# Patient Record
Sex: Male | Born: 1970
Health system: Southern US, Community
[De-identification: ages and names within clinical notes are randomized; demographics above are authoritative.]

## PROBLEM LIST (undated history)

## (undated) ENCOUNTER — Ambulatory Visit

## (undated) DIAGNOSIS — T7840XA Allergy, unspecified, initial encounter: Secondary | ICD-10-CM

## (undated) DIAGNOSIS — C801 Malignant (primary) neoplasm, unspecified: Secondary | ICD-10-CM

## (undated) DIAGNOSIS — J45909 Unspecified asthma, uncomplicated: Secondary | ICD-10-CM

## (undated) DIAGNOSIS — K219 Gastro-esophageal reflux disease without esophagitis: Secondary | ICD-10-CM

## (undated) HISTORY — PX: WRIST SURGERY: SHX841

## (undated) HISTORY — DX: Unspecified asthma, uncomplicated: J45.909

## (undated) HISTORY — DX: Allergy, unspecified, initial encounter: T78.40XA

## (undated) HISTORY — PX: FINGER SURGERY: SHX640

## (undated) HISTORY — DX: Malignant (primary) neoplasm, unspecified: C80.1

## (undated) HISTORY — PX: COLONOSCOPY: SHX174

## (undated) HISTORY — DX: Gastro-esophageal reflux disease without esophagitis: K21.9

---

## 1998-07-17 ENCOUNTER — Encounter: Payer: Self-pay | Admitting: Emergency Medicine

## 1998-07-17 ENCOUNTER — Emergency Department (HOSPITAL_COMMUNITY): Admission: EM | Admit: 1998-07-17 | Discharge: 1998-07-17 | Payer: Self-pay | Admitting: Emergency Medicine

## 1998-08-18 ENCOUNTER — Observation Stay (HOSPITAL_COMMUNITY): Admission: EM | Admit: 1998-08-18 | Discharge: 1998-08-19 | Payer: Self-pay | Admitting: Emergency Medicine

## 1998-08-19 ENCOUNTER — Encounter: Payer: Self-pay | Admitting: General Surgery

## 1998-09-25 ENCOUNTER — Encounter: Payer: Self-pay | Admitting: Emergency Medicine

## 1998-09-25 ENCOUNTER — Inpatient Hospital Stay: Admission: EM | Admit: 1998-09-25 | Discharge: 1998-09-28 | Payer: Self-pay | Admitting: Emergency Medicine

## 1998-09-26 ENCOUNTER — Encounter: Payer: Self-pay | Admitting: Cardiothoracic Surgery

## 1998-09-27 ENCOUNTER — Encounter: Payer: Self-pay | Admitting: Orthopedic Surgery

## 1998-09-27 ENCOUNTER — Encounter: Payer: Self-pay | Admitting: Cardiothoracic Surgery

## 1998-09-28 ENCOUNTER — Encounter: Payer: Self-pay | Admitting: Cardiothoracic Surgery

## 1999-06-19 ENCOUNTER — Encounter: Payer: Self-pay | Admitting: Emergency Medicine

## 1999-06-20 ENCOUNTER — Inpatient Hospital Stay (HOSPITAL_COMMUNITY): Admission: EM | Admit: 1999-06-20 | Discharge: 1999-06-24 | Payer: Self-pay

## 2004-06-14 ENCOUNTER — Emergency Department (HOSPITAL_COMMUNITY): Admission: EM | Admit: 2004-06-14 | Discharge: 2004-06-14 | Payer: Self-pay | Admitting: Emergency Medicine

## 2005-04-03 ENCOUNTER — Emergency Department (HOSPITAL_COMMUNITY): Admission: EM | Admit: 2005-04-03 | Discharge: 2005-04-03 | Payer: Self-pay | Admitting: Emergency Medicine

## 2006-03-14 ENCOUNTER — Encounter: Payer: Self-pay | Admitting: Emergency Medicine

## 2007-05-23 ENCOUNTER — Ambulatory Visit (HOSPITAL_COMMUNITY): Admission: RE | Admit: 2007-05-23 | Discharge: 2007-05-23 | Payer: Self-pay | Admitting: Internal Medicine

## 2008-09-03 ENCOUNTER — Emergency Department (HOSPITAL_COMMUNITY): Admission: EM | Admit: 2008-09-03 | Discharge: 2008-09-03 | Payer: Self-pay | Admitting: Emergency Medicine

## 2008-09-05 ENCOUNTER — Ambulatory Visit (HOSPITAL_COMMUNITY): Admission: RE | Admit: 2008-09-05 | Discharge: 2008-09-05 | Payer: Self-pay | Admitting: Orthopedic Surgery

## 2009-11-13 HISTORY — PX: KNEE ARTHROSCOPY: SHX127

## 2010-04-27 ENCOUNTER — Ambulatory Visit: Payer: Self-pay | Admitting: Diagnostic Radiology

## 2010-04-27 ENCOUNTER — Emergency Department (HOSPITAL_BASED_OUTPATIENT_CLINIC_OR_DEPARTMENT_OTHER): Admission: EM | Admit: 2010-04-27 | Discharge: 2010-04-27 | Payer: Self-pay | Admitting: Emergency Medicine

## 2010-06-29 ENCOUNTER — Encounter: Admission: RE | Admit: 2010-06-29 | Discharge: 2010-06-29 | Payer: Self-pay | Admitting: Orthopaedic Surgery

## 2011-03-28 NOTE — Op Note (Signed)
NAME:  Bradley Roy, Bradley Roy           ACCOUNT NO.:  [id number]   MEDICAL RECORD NO.:  [id number]          PATIENT TYPE:  AMB   LOCATION:  SDS                          FACILITY:  [facility]   PHYSICIAN:  Nadara Mustard, MD     DATE OF BIRTH:  Nov 10, 1971   DATE OF PROCEDURE:  09/05/2008  DATE OF DISCHARGE:                               OPERATIVE REPORT   PREOPERATIVE DIAGNOSIS:  Crush injury, right long finger.   POSTOPERATIVE DIAGNOSIS:  Crush injury, right long finger.   PROCEDURE:  Irrigation and debridement, exploration, and neurolysis,  right long finger.   SURGEON:  Nadara Mustard, MD   ANESTHESIA:  General.   ESTIMATED BLOOD LOSS:  Minimal.   ANTIBIOTICS:  1 gram of Kefzol.   DRAINS:  None.   COMPLICATIONS:  None.   TOURNIQUET TIME:  Approximately 20 minutes at 250 mmHg at the arm.   DISPOSITION:  To PACU in stable condition.   PROCEDURE:  The patient is a 40 year old gentleman who was working on a  Runner, broadcasting/film/video, one of the booms caught his finger in an opening and crushed the  finger through the 2 metal pieces of the crane boom.  The patient was  brought to the emergency room.  He underwent irrigation and debridement,  loose wound closure and was seen in the office yesterday.  Examination  in the office, he did have FDS function, but had complete lack of FDP  function.  He did have capillary refill of approximately 2 seconds and  had numbness on the radial and ulnar aspects of the finger, but did have  gross sensation.  Due to lack of FDP function and decreased sensation,  the patient was brought at this time to the operating room for FDP  repair, exploration, and possible nerve repair.  Risks and benefits were  discussed including infection, neurovascular injury, persistent pain,  loss of function, dystrophic changes, and potential loss of finger.  The  patient states he understands and wished to proceed at this time.   DESCRIPTION OF PROCEDURE:  The patient was brought to OR  room #1 and  underwent a general anesthetic.  After adequate level of anesthesia  obtained, the patient's right upper extremity was prepped using DuraPrep  and draped into a sterile field.  The sutures were removed.  The wounds  were irrigated with normal saline.  The dorsal wound was explored and  the dorsal wound extended from the PIP joint to the DIP joint dorsally.  Examination showed an intact extensor mechanism with no other injuries  other than the complex laceration.  This was repaired.  The wound was  approximately 4 cm in length and this was repaired with 4-0 nylon.  Attention was then focused on the palmar aspect of the finger.  The  complex wound was obliquely across the volar aspect of the DIP.  This  was then extended in an extensile fashion proximally with a Z technique  proximally and distally.  Examination down to the pulleys of the flexor  tendon showed intact pulleys and showed an intact FDP and FDS.  These  were not  disturbed.  Tension on the FDP showed good attachment with no  loss of continuity of the FDP.  Neurolysis of the radial, ulnar, and  digital bundles showed disruption of part of the digital nerves as they  bifurcated at the DIP joint.  Dr. Mina Marble was in the room to look at  this and he did not feel that the repair was necessary.  At the  bifurcation, there was continuity of nerve fibers intact.  It was hard  of the bifurcation that was completely avulsed.  After irrigation and  debridement, the tourniquet was deflated after approximately 20 minutes;  however, this was released prior to the end of the surgery.  Further  irrigation and debridement and the wound was closed using 4-0 nylon.  The length of the wound was approximately 6 cm.  After closure, Adaptic  was applied, 4x4s, Webril, and Coban.  The patient was extubated, taken  to PACU in stable condition.  Prescription for Vicodin and doxycycline.  He will complete his course of Augmentin.  Plan to  follow up in the  office in 1 week.      Nadara Mustard, MD  Electronically Signed     MVD/MEDQ  D:  09/05/2008  T:  09/05/2008  Job:  [id number]

## 2011-08-15 LAB — DIFFERENTIAL
Basophils Relative: 1
Eosinophils Absolute: 0.1
Eosinophils Relative: 1
Lymphocytes Relative: 27
Lymphs Abs: 1.7
Monocytes Absolute: 0.6
Monocytes Relative: 10

## 2011-08-15 LAB — COMPREHENSIVE METABOLIC PANEL
ALT: 15
AST: 19
Albumin: 4.4
Alkaline Phosphatase: 44
Calcium: 9.3
Creatinine, Ser: 0.92
GFR calc non Af Amer: >60
Potassium: 4.8
Total Bilirubin: 1.7 — ABNORMAL HIGH

## 2011-08-15 LAB — PROTIME-INR: INR: 1

## 2011-08-15 LAB — CBC
HCT: 45.8
Hemoglobin: 15.8
MCHC: 34.4
Platelets: 240
RDW: 12.8

## 2014-02-09 ENCOUNTER — Other Ambulatory Visit: Payer: Self-pay | Admitting: Internal Medicine

## 2014-02-22 DIAGNOSIS — J45909 Unspecified asthma, uncomplicated: Secondary | ICD-10-CM | POA: Insufficient documentation

## 2014-02-24 ENCOUNTER — Ambulatory Visit (INDEPENDENT_AMBULATORY_CARE_PROVIDER_SITE_OTHER): Payer: BC Managed Care – PPO | Admitting: Physician Assistant

## 2014-02-24 ENCOUNTER — Encounter: Payer: Self-pay | Admitting: Internal Medicine

## 2014-02-24 VITALS — BP 116/80 | HR 84 | Temp 98.1°F | Resp 16 | Ht 72.5 in | Wt 211.0 lb

## 2014-02-24 DIAGNOSIS — J329 Chronic sinusitis, unspecified: Secondary | ICD-10-CM

## 2014-02-24 DIAGNOSIS — M545 Low back pain, unspecified: Secondary | ICD-10-CM

## 2014-02-24 DIAGNOSIS — Z79899 Other long term (current) drug therapy: Secondary | ICD-10-CM

## 2014-02-24 DIAGNOSIS — IMO0001 Reserved for inherently not codable concepts without codable children: Secondary | ICD-10-CM

## 2014-02-24 DIAGNOSIS — I10 Essential (primary) hypertension: Secondary | ICD-10-CM

## 2014-02-24 LAB — BASIC METABOLIC PANEL WITH GFR
BUN: 16 mg/dL (ref 6–23)
CHLORIDE: 100 meq/L (ref 96–112)
CO2: 29 meq/L (ref 19–32)
Calcium: 9 mg/dL (ref 8.4–10.5)
Creat: 1.04 mg/dL (ref 0.50–1.35)
GFR, EST NON AFRICAN AMERICAN: 89 mL/min
GLUCOSE: 82 mg/dL (ref 70–99)
Potassium: 4.4 mEq/L (ref 3.5–5.3)
SODIUM: 136 meq/L (ref 135–145)

## 2014-02-24 LAB — HEPATIC FUNCTION PANEL
ALT: 15 U/L (ref 0–53)
AST: 18 U/L (ref 0–37)
Albumin: 4.3 g/dL (ref 3.5–5.2)
Alkaline Phosphatase: 51 U/L (ref 39–117)
BILIRUBIN INDIRECT: 0.9 mg/dL (ref 0.2–1.2)
BILIRUBIN TOTAL: 1.1 mg/dL (ref 0.2–1.2)
Bilirubin, Direct: 0.2 mg/dL (ref 0.0–0.3)
TOTAL PROTEIN: 7.1 g/dL (ref 6.0–8.3)

## 2014-02-24 LAB — MAGNESIUM: Magnesium: 1.8 mg/dL (ref 1.5–2.5)

## 2014-02-24 LAB — CK: CK TOTAL: 50 U/L (ref 7–232)

## 2014-02-24 MED ORDER — PREDNISONE 20 MG PO TABS: ORAL_TABLET | ORAL | Status: DC

## 2014-02-24 MED ORDER — AZITHROMYCIN 250 MG PO TABS: ORAL_TABLET | ORAL | Status: AC

## 2014-02-24 MED ORDER — DEXAMETHASONE SODIUM PHOSPHATE 10 MG/ML IJ SOLN
10.0000 mg | Freq: Once | INTRAMUSCULAR | Status: AC
  Administered 2014-02-24: 10 mg via INTRAMUSCULAR

## 2014-02-24 MED ORDER — HYDROCODONE-ACETAMINOPHEN 5-325 MG PO TABS: 1.0000 | ORAL_TABLET | Freq: Four times a day (QID) | ORAL | Status: DC | PRN

## 2014-02-24 NOTE — Patient Instructions (Signed)
Myalgia, Adult Myalgia is the medical term for muscle pain. It is a symptom of many things. Nearly everyone at some time in their life has this. The most common cause for muscle pain is overuse or straining and more so when you are not in shape. Injuries and muscle bruises cause myalgias. Muscle pain without a history of injury can also be caused by a virus. It frequently comes along with the flu. Myalgia not caused by muscle strain can be present in a large number of infectious diseases. Some autoimmune diseases like lupus and fibromyalgia can cause muscle pain. Myalgia may be mild, or severe. SYMPTOMS  The symptoms of myalgia are simply muscle pain. Most of the time this is short lived and the pain goes away without treatment. DIAGNOSIS  Myalgia is diagnosed by your caregiver by taking your history. This means you tell him when the problems began, what they are, and what has been happening. If this has not been a long term problem, your caregiver may want to watch for a while to see what will happen. If it has been long term, they may want to do additional testing. TREATMENT  The treatment depends on what the underlying cause of the muscle pain is. Often anti-inflammatory medications will help. HOME CARE INSTRUCTIONS  If the pain in your muscles came from overuse, slow down your activities until the problems go away.  Myalgia from overuse of a muscle can be treated with alternating hot and cold packs on the muscle affected or with cold for the first couple days. If either heat or cold seems to make things worse, stop their use.  Apply ice to the sore area for 15-20 minutes, 03-04 times per day, while awake for the first 2 days of muscle soreness, or as directed. Put the ice in a plastic bag and place a towel between the bag of ice and your skin.  Only take over-the-counter or prescription medicines for pain, discomfort, or fever as directed by your caregiver.  Regular gentle exercise may help if  you are not active.  Stretching before strenuous exercise can help lower the risk of myalgia. It is normal when beginning an exercise regimen to feel some muscle pain after exercising. Muscles that have not been used frequently will be sore at first. If the pain is extreme, this may mean injury to a muscle. SEEK MEDICAL CARE IF:  You have an increase in muscle pain that is not relieved with medication.  You begin to run a temperature.  You develop nausea and vomiting.  You develop a stiff and painful neck.  You develop a rash.  You develop muscle pain after a tick bite.  You have continued muscle pain while working out even after you are in good condition. SEEK IMMEDIATE MEDICAL CARE IF: Any of your problems are getting worse and medications are not helping. MAKE SURE YOU:   Understand these instructions.  Will watch your condition.sinus Sinusitis Sinusitis is redness, soreness, and swelling (inflammation) of the paranasal sinuses. Paranasal sinuses are air pockets within the bones of your face (beneath the eyes, the middle of the forehead, or above the eyes). In healthy paranasal sinuses, mucus is able to drain out, and air is able to circulate through them by way of your nose. However, when your paranasal sinuses are inflamed, mucus and air can become trapped. This can allow bacteria and other germs to grow and cause infection. Sinusitis can develop quickly and last only a short time (acute) or  continue over a long period (chronic). Sinusitis that lasts for more than 12 weeks is considered chronic.  CAUSES  Causes of sinusitis include: Allergies. Structural abnormalities, such as displacement of the cartilage that separates your nostrils (deviated septum), which can decrease the air flow through your nose and sinuses and affect sinus drainage. Functional abnormalities, such as when the small hairs (cilia) that line your sinuses and help remove mucus do not work properly or are not  present. SYMPTOMS  Symptoms of acute and chronic sinusitis are the same. The primary symptoms are pain and pressure around the affected sinuses. Other symptoms include: Upper toothache. Earache. Headache. Bad breath. Decreased sense of smell and taste. A cough, which worsens when you are lying flat. Fatigue. Fever. Thick drainage from your nose, which often is green and may contain pus (purulent). Swelling and warmth over the affected sinuses. DIAGNOSIS  Your caregiver will perform a physical exam. During the exam, your caregiver may: Look in your nose for signs of abnormal growths in your nostrils (nasal polyps). Tap over the affected sinus to check for signs of infection. View the inside of your sinuses (endoscopy) with a special imaging device with a light attached (endoscope), which is inserted into your sinuses. If your caregiver suspects that you have chronic sinusitis, one or more of the following tests may be recommended: Allergy tests. Nasal culture A sample of mucus is taken from your nose and sent to a lab and screened for bacteria. Nasal cytology A sample of mucus is taken from your nose and examined by your caregiver to determine if your sinusitis is related to an allergy. TREATMENT  Most cases of acute sinusitis are related to a viral infection and will resolve on their own within 10 days. Sometimes medicines are prescribed to help relieve symptoms (pain medicine, decongestants, nasal steroid sprays, or saline sprays).  However, for sinusitis related to a bacterial infection, your caregiver will prescribe antibiotic medicines. These are medicines that will help kill the bacteria causing the infection.  Rarely, sinusitis is caused by a fungal infection. In theses cases, your caregiver will prescribe antifungal medicine. For some cases of chronic sinusitis, surgery is needed. Generally, these are cases in which sinusitis recurs more than 3 times per year, despite other  treatments. HOME CARE INSTRUCTIONS  Drink plenty of water. Water helps thin the mucus so your sinuses can drain more easily. Use a humidifier. Inhale steam 3 to 4 times a day (for example, sit in the bathroom with the shower running). Apply a warm, moist washcloth to your face 3 to 4 times a day, or as directed by your caregiver. Use saline nasal sprays to help moisten and clean your sinuses. Take over-the-counter or prescription medicines for pain, discomfort, or fever only as directed by your caregiver. SEEK IMMEDIATE MEDICAL CARE IF: You have increasing pain or severe headaches. You have nausea, vomiting, or drowsiness. You have swelling around your face. You have vision problems. You have a stiff neck. You have difficulty breathing. MAKE SURE YOU:  Understand these instructions. Will watch your condition. Will get help right away if you are not doing well or get worse. Document Released: 10/30/2005 Document Revised: 01/22/2012 Document Reviewed: 11/14/2011 [facility] Patient Information 2014 [place], [place].   Will get help right away if you are not doing well or get worse.   Document Released: 09/21/2006 Document Revised: 01/22/2012 Document Reviewed: 12/11/2006 [employer] Patient Information 2014 [place], [place].

## 2014-02-24 NOTE — Progress Notes (Signed)
   Subjective:    Patient ID: Bradley Roy, male    DOB: [birth year], 43 y.o.   MRN: [id number]  HPI 43 y.o. states for the past week he has been feeling bed. He thought that is was allergies, got more flonase but then it got worse. He has had a fever of 99 - 101, sinus pain, aches and pain, hips and back hurting. He went to [facility] Sunday and was put on Cefdinir 300mg  and loratatine and a shot of something. He has had sinus congestion, pain, slightly productive cough with some wheezing, no SOB, CP. Denies nausea, diarrhea. He has bilateral leg pain with radiation down both legs, R>L.    Review of Systems  Constitutional: Positive for fever and fatigue. Negative for chills and diaphoresis.  HENT: Positive for congestion, sinus pressure, sneezing and sore throat. Negative for ear pain, trouble swallowing and voice change.   Eyes: Negative.   Respiratory: Positive for cough. Negative for chest tightness, shortness of breath and wheezing.   Cardiovascular: Negative.   Gastrointestinal: Negative.   Genitourinary: Negative.   Musculoskeletal: Positive for myalgias (bilateral leg pain, worse when sitting still for too long). Negative for arthralgias, back pain, gait problem, joint swelling, neck pain and neck stiffness.  Neurological: Negative.  Negative for headaches.       Objective:   Physical Exam  Constitutional: He appears well-developed and well-nourished.  HENT:  Head: Normocephalic and atraumatic.  Right Ear: External ear normal.  Left Ear: External ear normal.  Nose: Right sinus exhibits maxillary sinus tenderness. Left sinus exhibits maxillary sinus tenderness.  Mouth/Throat: Oropharynx is clear and moist.  Eyes: Conjunctivae are normal. Pupils are equal, round, and reactive to light.  Neck: Normal range of motion. Neck supple.  Cardiovascular: Normal rate, regular rhythm and normal heart sounds.   Pulmonary/Chest: Effort normal and breath sounds normal. He has no wheezes.   Abdominal: Soft. Bowel sounds are normal.  Musculoskeletal:  + RSI pain, negative straight leg raise.   Lymphadenopathy:    He has no cervical adenopathy.  Skin: Skin is warm and dry.       Assessment & Plan:  Myalgias- will give dexamethasone 10mg  shot RSI, check CPK, etc Sinusitis- stop cefnidir and start zpak 1 prednisone 20 mg # 10

## 2014-02-25 ENCOUNTER — Ambulatory Visit: Payer: Self-pay | Admitting: Internal Medicine

## 2014-02-25 LAB — CBC WITH DIFFERENTIAL/PLATELET
BASOS ABS: 0 10*3/uL (ref 0.0–0.1)
Basophils Relative: 1 % (ref 0–1)
EOS ABS: 0.2 10*3/uL (ref 0.0–0.7)
EOS PCT: 5 % (ref 0–5)
HEMATOCRIT: 44.4 % (ref 39.0–52.0)
HEMOGLOBIN: 16.7 g/dL (ref 13.0–17.0)
Lymphocytes Relative: 32 % (ref 12–46)
Lymphs Abs: 1.2 10*3/uL (ref 0.7–4.0)
MCH: 30.8 pg (ref 26.0–34.0)
MCHC: 34.4 g/dL (ref 30.0–36.0)
MCV: 81.9 fL (ref 78.0–100.0)
MONOS PCT: 19 % — AB (ref 3–12)
Monocytes Absolute: 0.7 10*3/uL (ref 0.1–1.0)
NEUTROS PCT: 43 % (ref 43–77)
Neutro Abs: 1.7 10*3/uL (ref 1.7–7.7)
Platelets: 172 10*3/uL (ref 150–400)
RBC: 5.42 MIL/uL (ref 4.22–5.81)
RDW: 13.1 % (ref 11.5–15.5)
WBC: 3.9 10*3/uL — AB (ref 4.0–10.5)

## 2014-03-08 ENCOUNTER — Other Ambulatory Visit: Payer: Self-pay | Admitting: Physician Assistant

## 2014-05-03 DIAGNOSIS — E559 Vitamin D deficiency, unspecified: Secondary | ICD-10-CM | POA: Insufficient documentation

## 2014-05-03 DIAGNOSIS — Z79899 Other long term (current) drug therapy: Secondary | ICD-10-CM | POA: Insufficient documentation

## 2014-05-03 NOTE — Progress Notes (Signed)
Patient ID: Bradley Roy, male   DOB: 17-Nov-1970, 43 y.o.   MRN: [id number]   Annual Screening Comprehensive Examination   This very nice 43 y.o.male presents for complete physical.  Patient has no major health issues.     Finally, patient has history of Vitamin D Deficiency with last vitamin D      Current Outpatient Prescriptions on File Prior to Visit  Medication Sig Dispense Refill  . azelastine (ASTELIN) 137 MCG/SPRAY nasal spray USE 1-2 SPRAYS IN EACH NOSTRIL TWICE A DAY  30 mL  6  . fluticasone (FLONASE) 50 MCG/ACT nasal spray USE 1 SPRAY IN EACH NOSTRIL TWICE A DAY  16 g  6  . HYDROcodone-acetaminophen (NORCO) 5-325 MG per tablet Take 1 tablet by mouth every 6 (six) hours as needed for moderate pain.  60 tablet  0   No current facility-administered medications on file prior to visit.    Allergies  Allergen Reactions  . Codeine     Headache    Past Medical History  Diagnosis Date  . Allergy   . Asthma     Past Surgical History  Procedure Laterality Date  . Wrist surgery Left   . Knee arthroscopy Right 2011  . Finger surgery Right     middle finger    No family history on file.  History   Social History  . Marital Status: Married    Spouse Name: N/A    Number of Children: N/A  . Years of Education: N/A   Occupational History  . Not on file.   Social History Main Topics  . Smoking status: Former Research scientist (life sciences)  . Smokeless tobacco: Not on file  . Alcohol Use: No     Comment: Rare  . Drug Use: No  . Sexual Activity: Not on file   Other Topics Concern  . Not on file   Social History Narrative  . No narrative on file    ROS Constitutional: Denies fever, chills, weight loss/gain, headaches, insomnia, fatigue, night sweats, and change in appetite. Eyes: Denies redness, blurred vision, diplopia, discharge, itchy, watery eyes.  ENT: Denies discharge, congestion, post nasal drip, epistaxis, sore throat, earache, hearing loss, dental pain, Tinnitus,  Vertigo, Sinus pain, snoring.  Cardio: Denies chest pain, palpitations, irregular heartbeat, syncope, dyspnea, diaphoresis, orthopnea, PND, claudication, edema Respiratory: denies cough, dyspnea, DOE, pleurisy, hoarseness, laryngitis, wheezing.  Gastrointestinal: Denies dysphagia, heartburn, reflux, water brash, pain, cramps, nausea, vomiting, bloating, diarrhea, constipation, hematemesis, melena, hematochezia, jaundice, hemorrhoids Genitourinary: Denies dysuria, frequency, urgency, nocturia, hesitancy, discharge, hematuria, flank pain Breast: Breast lumps, nipple discharge, bleeding.  Musculoskeletal: Denies arthralgia, myalgia, stiffness, Jt. Swelling, pain, limp, and strain/sprain. Skin: Denies puritis, rash, hives, warts, acne, eczema, changing in skin lesion Neuro: Weakness, tremor, incoordination, spasms, paresthesia, pain Psychiatric: Denies confusion, memory loss, sensory loss. Endocrine: Denies change in weight, skin, hair change, nocturia, and paresthesia, diabetic polys, visual blurring, hyper /hypo glycemic episodes.  Heme/Lymph: No excessive bleeding, bruising, enlarged lymph nodes.   Physical Exam  There were no vitals taken for this visit.  General Appearance: Well nourished, in no apparent distress. Eyes: PERRLA, EOMs, conjunctiva no swelling or erythema, normal fundi and vessels. Sinuses: No frontal/maxillary tenderness ENT/Mouth: EACs patent / TMs  nl. Nares clear without erythema, swelling, mucoid exudates. Oral hygiene is good. No erythema, swelling, or exudate. Tongue normal, non-obstructing. Tonsils not swollen or erythematous. Hearing normal.  Neck: Supple, thyroid normal. No bruits, nodes or JVD. Respiratory: Respiratory effort normal.  BS equal and clear bilateral  without rales, rhonci, wheezing or stridor. Cardio: Heart sounds are normal with regular rate and rhythm and no murmurs, rubs or gallops. Peripheral pulses are normal and equal bilaterally without edema. No  aortic or femoral bruits. Chest: symmetric with normal excursions and percussion. Breasts: Symmetric, without lumps, nipple discharge, retractions, or fibrocystic changes.  Abdomen: Flat, soft, with bowl sounds. Nontender, no guarding, rebound, hernias, masses, or organomegaly.  Lymphatics: Non tender without lymphadenopathy.  Genitourinary:  Musculoskeletal: Full ROM all peripheral extremities, joint stability, 5/5 strength, and normal gait. Skin: Warm and dry without rashes, lesions, cyanosis, clubbing or  ecchymosis.  Neuro: Cranial nerves intact, reflexes equal bilaterally. Normal muscle tone, no cerebellar symptoms. Sensation intact.  Pysch: Awake and oriented X 3, normal affect, Insight and Judgment appropriate.   Assessment and Plan  1. Annual Screening Examination 2. Asthma  3. Vitamin D Deficiency   4.    Continue prudent diet as discussed, weight control, regular exercise, and medications. Routine screening labs and tests as requested with regular follow-up as recommended.

## 2014-05-04 ENCOUNTER — Ambulatory Visit: Payer: BC Managed Care – PPO | Admitting: Internal Medicine

## 2014-05-04 ENCOUNTER — Encounter: Payer: Self-pay | Admitting: Internal Medicine

## 2014-05-04 VITALS — BP 106/68 | HR 56 | Temp 98.1°F | Resp 16 | Ht 73.5 in | Wt 208.6 lb

## 2014-05-04 DIAGNOSIS — Z1212 Encounter for screening for malignant neoplasm of rectum: Secondary | ICD-10-CM

## 2014-05-04 DIAGNOSIS — Z79899 Other long term (current) drug therapy: Secondary | ICD-10-CM

## 2014-05-04 DIAGNOSIS — Z113 Encounter for screening for infections with a predominantly sexual mode of transmission: Secondary | ICD-10-CM

## 2014-05-04 DIAGNOSIS — R740 Nonspecific elevation of levels of transaminase and lactic acid dehydrogenase [LDH]: Secondary | ICD-10-CM

## 2014-05-04 DIAGNOSIS — Z125 Encounter for screening for malignant neoplasm of prostate: Secondary | ICD-10-CM

## 2014-05-04 DIAGNOSIS — C44229 Squamous cell carcinoma of skin of left ear and external auricular canal: Secondary | ICD-10-CM

## 2014-05-04 DIAGNOSIS — E559 Vitamin D deficiency, unspecified: Secondary | ICD-10-CM

## 2014-05-04 DIAGNOSIS — Z23 Encounter for immunization: Secondary | ICD-10-CM

## 2014-05-04 DIAGNOSIS — Z111 Encounter for screening for respiratory tuberculosis: Secondary | ICD-10-CM

## 2014-05-04 DIAGNOSIS — J45909 Unspecified asthma, uncomplicated: Secondary | ICD-10-CM

## 2014-05-04 DIAGNOSIS — R7401 Elevation of levels of liver transaminase levels: Secondary | ICD-10-CM

## 2014-05-04 DIAGNOSIS — Z0000 Encounter for general adult medical examination without abnormal findings: Secondary | ICD-10-CM

## 2014-05-04 LAB — CBC WITH DIFFERENTIAL/PLATELET
Basophils Absolute: 0.1 10*3/uL (ref 0.0–0.1)
Basophils Relative: 1 % (ref 0–1)
EOS PCT: 2 % (ref 0–5)
Eosinophils Absolute: 0.1 10*3/uL (ref 0.0–0.7)
HEMATOCRIT: 42.1 % (ref 39.0–52.0)
Hemoglobin: 15.2 g/dL (ref 13.0–17.0)
LYMPHS ABS: 1.9 10*3/uL (ref 0.7–4.0)
Lymphocytes Relative: 31 % (ref 12–46)
MCH: 31 pg (ref 26.0–34.0)
MCHC: 36.1 g/dL — ABNORMAL HIGH (ref 30.0–36.0)
MCV: 85.7 fL (ref 78.0–100.0)
MONO ABS: 0.5 10*3/uL (ref 0.1–1.0)
MONOS PCT: 9 % (ref 3–12)
NEUTROS ABS: 3.4 10*3/uL (ref 1.7–7.7)
Neutrophils Relative %: 57 % (ref 43–77)
Platelets: 239 10*3/uL (ref 150–400)
RBC: 4.91 MIL/uL (ref 4.22–5.81)
RDW: 13.8 % (ref 11.5–15.5)
WBC: 6 10*3/uL (ref 4.0–10.5)

## 2014-05-04 NOTE — Progress Notes (Signed)
Patient ID: Bradley Roy, male   DOB: Aug 06, 1971, 43 y.o.   MRN: [id number]   Annual Screening Comprehensive Examination   This very nice 43 y.o.MWM presents for complete physical.  Patient has no major health issues. He does have remote Hx/o elevated BP's of 138-140/90 in [id number] 2012 and ha been monitored expectantly.Today's BP is WNL @ 106/68. He doe have a Hx/o seasonal allergic rhinitis.   Finally, patient has history of Vitamin D Deficiency of 33 in 2008 and last vitamin D was 56 in 2012.  Medication Sig  . ASTELIN nasal spray USE 1-2 SPRAYS IN EACH NOSTRIL TWICE A DAY  . FLONASE  nasal spray USE 1 SPRAY IN EACH NOSTRIL TWICE A DAY   Allergies  Allergen Reactions  . Codeine     Headache   Past Medical History  Diagnosis Date  . Allergy   . Asthma    Past Surgical History  Procedure Laterality Date  . Wrist surgery Left   . Knee arthroscopy Right 2011  . Finger surgery Right     middle finger   History   Social History  . Marital Status: Married    Spouse Name: N/A    Number of Children: N/A  . 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 Smoking status: Former Research scientist (life sciences)  . Smokeless tobacco: Not on file  . Alcohol Use: No     Comment: Rare  . Drug Use: No  . Sexual Activity: Not on file    ROS Constitutional: Denies fever, chills, weight loss/gain, headaches, insomnia, fatigue, night sweats, and change in appetite. Eyes: Denies redness, blurred vision, diplopia, discharge, itchy, watery eyes.  ENT: Denies discharge, congestion, post nasal drip, epistaxis, sore throat, earache, hearing loss, dental pain, Tinnitus, Vertigo, Sinus pain, snoring.  Cardio: Denies chest pain, palpitations, irregular heartbeat, syncope, dyspnea, diaphoresis, orthopnea, PND, claudication, edema Respiratory: denies cough, dyspnea, DOE, pleurisy, hoarseness, laryngitis, wheezing.  Gastrointestinal: Denies dysphagia,  heartburn, reflux, water brash, pain, cramps, nausea, vomiting, bloating, diarrhea, constipation, hematemesis, melena, hematochezia, jaundice, hemorrhoids Genitourinary: Denies dysuria, frequency, urgency, nocturia, hesitancy, discharge, hematuria, flank pain Breast: Breast lumps, nipple discharge, bleeding.  Musculoskeletal: Denies arthralgia, myalgia, stiffness, Jt. Swelling, pain, limp, and strain/sprain. Skin: Denies puritis, rash, hives, warts, acne, eczema, changing in skin lesion Neuro: Weakness, tremor, incoordination, spasms, paresthesia, pain Psychiatric: Denies confusion, memory loss, sensory loss. Endocrine: Denies change in weight, skin, hair change, nocturia, and paresthesia, diabetic polys, visual blurring, hyper /hypo glycemic episodes.  Heme/Lymph: No excessive bleeding, bruising, enlarged lymph nodes.   Physical Exam  BP 106/68  Pulse 56  Temp 98.1 F   Resp 16  Ht 6' 1.5"   Wt 208 lb 9.6 oz   BMI 27.15 kg/m2  General Appearance: Well nourished, in no apparent distress. Eyes: PERRLA, EOMs, conjunctiva no swelling or erythema, normal fundi and vessels. Sinuses: No frontal/maxillary tenderness ENT/Mouth: EACs patent / TMs  nl. Nares clear without erythema, swelling, mucoid exudates. Oral hygiene is good. No erythema, swelling, or exudate. Tongue normal, non-obstructing. Tonsils not swollen or erythematous. Hearing normal.  Neck: Supple, thyroid normal. No bruits, nodes or JVD. Respiratory: Respiratory effort normal.  BS equal and clear bilateral without rales, rhonci, wheezing or stridor. Cardio: Heart sounds are normal with regular rate and rhythm and no murmurs, rubs or gallops. Peripheral pulses are normal and equal bilaterally without edema. No aortic or femoral bruits. Chest: symmetric with normal excursions and percussion.  Abdomen: Flat, soft, with bowl sounds. Nontender, no guarding, rebound, hernias, masses, or organomegaly.  Lymphatics: Non tender without  lymphadenopathy.  Genitourinary: Nl male. Testes & cords nl. No hernia. Musculoskeletal: Full ROM all peripheral extremities, joint stability, 5/5 strength, and normal gait. Skin: Warm and dry without rashes, lesions, cyanosis, clubbing or  ecchymosis.  Neuro: Cranial nerves intact, reflexes equal bilaterally. Normal muscle tone, no cerebellar symptoms. Sensation intact.  Pysch: Awake and oriented X 3, normal affect, Insight and Judgment appropriate.   Assessment and Plan  1. Annual Screening Examination 2. Hx/o elevated BP 3. Seasonal Allergic Rhinitis 4. Vitamin D Deficiency    Continue prudent diet as discussed, weight control, regular exercise, and medications. Routine screening labs and tests as requested with regular follow-up as recommended.

## 2014-05-04 NOTE — Patient Instructions (Signed)
Preventative Care for Adults, Male       REGULAR HEALTH EXAMS:  A routine yearly physical is a good way to check in with your primary care provider about your health and preventive screening. It is also an opportunity to share updates about your health and any concerns you have, and receive a thorough all-over exam.   Most health insurance companies pay for at least some preventative services.  Check with your health plan for specific coverages.  WHAT PREVENTATIVE SERVICES DO MEN NEED?  Adult men should have their weight and blood pressure checked regularly.   Men age 35 and older should have their cholesterol levels checked regularly.  Beginning at age 50 and continuing to age 75, men should be screened for colorectal cancer.  Certain people should may need continued testing until age 85.  Other cancer screening may include exams for testicular and prostate cancer.  Updating vaccinations is part of preventative care.  Vaccinations help protect against diseases such as the flu.  Lab tests are generally done as part of preventative care to screen for anemia and blood disorders, to screen for problems with the kidneys and liver, to screen for bladder problems, to check blood sugar, and to check your cholesterol level.  Preventative services generally include counseling about diet, exercise, avoiding tobacco, drugs, excessive alcohol consumption, and sexually transmitted infections.    GENERAL RECOMMENDATIONS FOR GOOD HEALTH:  Healthy diet:  Eat a variety of foods, including fruit, vegetables, animal or vegetable protein, such as meat, fish, chicken, and eggs, or beans, lentils, tofu, and grains, such as rice.  Drink plenty of water daily.  Decrease saturated fat in the diet, avoid lots of red meat, processed foods, sweets, fast foods, and fried foods.  Exercise:  Aerobic exercise helps maintain good heart health. At least 30-40 minutes of moderate-intensity exercise is recommended.  For example, a brisk walk that increases your heart rate and breathing. This should be done on most days of the week.   Find a type of exercise or a variety of exercises that you enjoy so that it becomes a part of your daily life.  Examples are running, walking, swimming, water aerobics, and biking.  For motivation and support, explore group exercise such as aerobic class, spin class, Zumba, Yoga,or  martial arts, etc.    Set exercise goals for yourself, such as a certain weight goal, walk or run in a race such as a 5k walk/run.  Speak to your primary care provider about exercise goals.  Disease prevention:  If you smoke or chew tobacco, find out from your caregiver how to quit. It can literally save your life, no matter how long you have been a tobacco user. If you do not use tobacco, never begin.   Maintain a healthy diet and normal weight. Increased weight leads to problems with blood pressure and diabetes.   The Body Mass Index or BMI is a way of measuring how much of your body is fat. Having a BMI above 27 increases the risk of heart disease, diabetes, hypertension, stroke and other problems related to obesity. Your caregiver can help determine your BMI and based on it develop an exercise and dietary program to help you achieve or maintain this important measurement at a healthful level.  High blood pressure causes heart and blood vessel problems.  Persistent high blood pressure should be treated with medicine if weight loss and exercise do not work.   Fat and cholesterol leaves deposits in your arteries   that can block them. This causes heart disease and vessel disease elsewhere in your body.  If your cholesterol is found to be high, or if you have heart disease or certain other medical conditions, then you may need to have your cholesterol monitored frequently and be treated with medication.   Ask if you should have a stress test if your history suggests this. A stress test is a test done on  a treadmill that looks for heart disease. This test can find disease prior to there being a problem.  Avoid drinking alcohol in excess (more than two drinks per day).  Avoid use of street drugs. Do not share needles with anyone. Ask for professional help if you need assistance or instructions on stopping the use of alcohol, cigarettes, and/or drugs.  Brush your teeth twice a day with fluoride toothpaste, and floss once a day. Good oral hygiene prevents tooth decay and gum disease. The problems can be painful, unattractive, and can cause other health problems. Visit your dentist for a routine oral and dental check up and preventive care every 6-12 months.   Look at your skin regularly.  Use a mirror to look at your back. Notify your caregivers of changes in moles, especially if there are changes in shapes, colors, a size larger than a pencil eraser, an irregular border, or development of new moles.  Safety:  Use seatbelts 100% of the time, whether driving or as a passenger.  Use safety devices such as hearing protection if you work in environments with loud noise or significant background noise.  Use safety glasses when doing any work that could send debris in to the eyes.  Use a helmet if you ride a bike or motorcycle.  Use appropriate safety gear for contact sports.  Talk to your caregiver about gun safety.  Use sunscreen with a SPF (or skin protection factor) of 15 or greater.  Lighter skinned people are at a greater risk of skin cancer. Don't forget to also wear sunglasses in order to protect your eyes from too much damaging sunlight. Damaging sunlight can accelerate cataract formation.   Practice safe sex. Use condoms. Condoms are used for birth control and to help reduce the spread of sexually transmitted infections (or STIs).  Some of the STIs are gonorrhea (the clap), chlamydia, syphilis, trichomonas, herpes, HPV (human papilloma virus) and HIV (human immunodeficiency virus) which causes AIDS.  The herpes, HIV and HPV are viral illnesses that have no cure. These can result in disability, cancer and death.   Keep carbon monoxide and smoke detectors in your home functioning at all times. Change the batteries every 6 months or use a model that plugs into the wall.   Vaccinations:  Stay up to date with your tetanus shots and other required immunizations. You should have a booster for tetanus every 10 years. Be sure to get your flu shot every year, since 5%-20% of the [place] population comes down with the flu. The flu vaccine changes each year, so being vaccinated once is not enough. Get your shot in the fall, before the flu season peaks.   Other vaccines to consider:  Pneumococcal vaccine to protect against certain types of pneumonia.  This is normally recommended for adults age 65 or older.  However, adults younger than 41 years old with certain underlying conditions such as diabetes, heart or lung disease should also receive the vaccine.  Shingles vaccine to protect against Varicella Zoster if you are older than age 60, or younger   than 41 years old with certain underlying illness.  Hepatitis A vaccine to protect against a form of infection of the liver by a virus acquired from food.  Hepatitis B vaccine to protect against a form of infection of the liver by a virus acquired from blood or body fluids, particularly if you work in health care.  If you plan to travel internationally, check with your local health department for specific vaccination recommendations.  Cancer Screening:  Most routine colon cancer screening begins at the age of 50. On a yearly basis, doctors may provide special easy to use take-home tests to check for hidden blood in the stool. Sigmoidoscopy or colonoscopy can detect the earliest forms of colon cancer and is life saving. These tests use a small camera at the end of a tube to directly examine the colon. Speak to your caregiver about this at age 50, when routine  screening begins (and is repeated every 5 years unless early forms of pre-cancerous polyps or small growths are found).   At the age of 50 men usually start screening for prostate cancer every year. Screening may begin at a younger age for those with higher risk. Those at higher risk include African-Americans or having a family history of prostate cancer. There are two types of tests for prostate cancer:   Prostate-specific antigen (PSA) testing. Recent studies raise questions about prostate cancer using PSA and you should discuss this with your caregiver.   Digital rectal exam (in which your doctor's lubricated and gloved finger feels for enlargement of the prostate through the anus).   Screening for testicular cancer.  Do a monthly exam of your testicles. Gently roll each testicle between your thumb and fingers, feeling for any abnormal lumps. The best time to do this is after a hot shower or bath when the tissues are looser. Notify your caregivers of any lumps, tenderness or changes in size or shape immediately.     

## 2014-05-05 LAB — PSA: PSA: 0.58 ng/mL (ref <=4.00)

## 2014-05-05 LAB — URINALYSIS, ROUTINE W REFLEX MICROSCOPIC
BILIRUBIN URINE: NEGATIVE
Glucose, UA: NEGATIVE mg/dL
Hgb urine dipstick: NEGATIVE
KETONES UR: NEGATIVE mg/dL
Leukocytes, UA: NEGATIVE
NITRITE: NEGATIVE
Protein, ur: NEGATIVE mg/dL
SPECIFIC GRAVITY, URINE: 1.027 (ref 1.005–1.030)
Urobilinogen, UA: 1 mg/dL (ref 0.0–1.0)
pH: 6 (ref 5.0–8.0)

## 2014-05-05 LAB — HIV ANTIBODY (ROUTINE TESTING W REFLEX): HIV 1&2 Ab, 4th Generation: NONREACTIVE

## 2014-05-05 LAB — LIPID PANEL
CHOL/HDL RATIO: 3.4 ratio
Cholesterol: 176 mg/dL (ref 0–200)
HDL: 52 mg/dL (ref >39)
LDL Cholesterol: 107 mg/dL — ABNORMAL HIGH (ref 0–99)
Triglycerides: 83 mg/dL (ref <150)
VLDL: 17 mg/dL (ref 0–40)

## 2014-05-05 LAB — VITAMIN D 25 HYDROXY (VIT D DEFICIENCY, FRACTURES): Vit D, 25-Hydroxy: 47 ng/mL (ref 30–89)

## 2014-05-05 LAB — BASIC METABOLIC PANEL WITH GFR
BUN: 21 mg/dL (ref 6–23)
CALCIUM: 9.7 mg/dL (ref 8.4–10.5)
CO2: 29 meq/L (ref 19–32)
CREATININE: 1.15 mg/dL (ref 0.50–1.35)
Chloride: 103 mEq/L (ref 96–112)
GFR, Est Non African American: 79 mL/min
GLUCOSE: 84 mg/dL (ref 70–99)
Potassium: 4.5 mEq/L (ref 3.5–5.3)
Sodium: 140 mEq/L (ref 135–145)

## 2014-05-05 LAB — HEPATITIS B CORE ANTIBODY, TOTAL: HEP B C TOTAL AB: NONREACTIVE

## 2014-05-05 LAB — MAGNESIUM: MAGNESIUM: 1.9 mg/dL (ref 1.5–2.5)

## 2014-05-05 LAB — TESTOSTERONE: TESTOSTERONE: 431 ng/dL (ref 300–890)

## 2014-05-05 LAB — RPR

## 2014-05-05 LAB — HEPATIC FUNCTION PANEL
ALBUMIN: 4.7 g/dL (ref 3.5–5.2)
ALK PHOS: 47 U/L (ref 39–117)
ALT: 10 U/L (ref 0–53)
AST: 14 U/L (ref 0–37)
Bilirubin, Direct: 0.3 mg/dL (ref 0.0–0.3)
Indirect Bilirubin: 1 mg/dL (ref 0.2–1.2)
Total Bilirubin: 1.3 mg/dL — ABNORMAL HIGH (ref 0.2–1.2)
Total Protein: 7.1 g/dL (ref 6.0–8.3)

## 2014-05-05 LAB — MICROALBUMIN / CREATININE URINE RATIO
CREATININE, URINE: 227.4 mg/dL
Microalb Creat Ratio: 2.2 mg/g (ref 0.0–30.0)
Microalb, Ur: 0.5 mg/dL (ref 0.00–1.89)

## 2014-05-05 LAB — HEMOGLOBIN A1C
Hgb A1c MFr Bld: 4.8 % (ref <5.7)
Mean Plasma Glucose: 91 mg/dL (ref <117)

## 2014-05-05 LAB — HEPATITIS C ANTIBODY: HCV AB: NEGATIVE

## 2014-05-05 LAB — INSULIN, FASTING: INSULIN FASTING, SERUM: 5 u[IU]/mL (ref 3–28)

## 2014-05-05 LAB — TSH: TSH: 1.219 u[IU]/mL (ref 0.350–4.500)

## 2014-05-05 LAB — HEPATITIS A ANTIBODY, TOTAL: HEP A TOTAL AB: NONREACTIVE

## 2014-05-05 LAB — HEPATITIS B SURFACE ANTIBODY,QUALITATIVE: HEP B S AB: NEGATIVE

## 2014-05-06 LAB — HEPATITIS B E ANTIBODY: Hepatitis Be Antibody: NONREACTIVE

## 2014-05-11 LAB — TB SKIN TEST
Induration: 0 mm
TB SKIN TEST: NEGATIVE

## 2014-11-23 ENCOUNTER — Encounter: Payer: Self-pay | Admitting: Physician Assistant

## 2014-11-23 ENCOUNTER — Ambulatory Visit (INDEPENDENT_AMBULATORY_CARE_PROVIDER_SITE_OTHER): Payer: BLUE CROSS/BLUE SHIELD | Admitting: Physician Assistant

## 2014-11-23 VITALS — BP 122/64 | HR 90 | Temp 99.8°F | Resp 18 | Ht 73.5 in | Wt 220.0 lb

## 2014-11-23 DIAGNOSIS — J0100 Acute maxillary sinusitis, unspecified: Secondary | ICD-10-CM

## 2014-11-23 DIAGNOSIS — J209 Acute bronchitis, unspecified: Secondary | ICD-10-CM

## 2014-11-23 MED ORDER — AZITHROMYCIN 250 MG PO TABS: ORAL_TABLET | ORAL | Status: AC

## 2014-11-23 MED ORDER — PROMETHAZINE-DM 6.25-15 MG/5ML PO SYRP: 5.0000 mL | ORAL_SOLUTION | Freq: Four times a day (QID) | ORAL | Status: DC | PRN

## 2014-11-23 MED ORDER — PREDNISONE 20 MG PO TABS: ORAL_TABLET | ORAL | Status: AC

## 2014-11-23 MED ORDER — ALBUTEROL SULFATE 108 (90 BASE) MCG/ACT IN AEPB: 2.0000 | INHALATION_SPRAY | Freq: Four times a day (QID) | RESPIRATORY_TRACT | Status: DC | PRN

## 2014-11-23 NOTE — Progress Notes (Signed)
Patient ID: Bradley Roy, male   DOB: [birth year], 44 y.o.   MRN: [id number]  HPI  A Caucasian 44 y.o.male presents to the office today due to cold symptoms that started 6 days ago (11/18/14).  Patient states sxs have gotten worse since starting.  Patient checked temperature at home and max temp was 101.3.  Patient states nothing makes the sxs worse.  Patient has tried Mucinex, Tylenol, Flonase and Nyquil OTC.  He had some congestion relief with Flonase.  Patient reports fever, chills, sweats, fatigue, body aches, congestion, ear pain/congestion, post nasal drip, sinus pressure, rhinorrhea, sore throat in the beginning, cough with yellow mucus, SOB, headache, dizziness and trouble sleeping.  He denies CP, abdominal pain, nausea, vomiting, diarrhea, constipation and rash.  Former smoker- [place]- started at age 38- a couple cigarettes here and there.  States he stopped smoking due to having asthma.    Review of Systems  All other systems reviewed and are negative.  Past Medical History-  Past Medical History  Diagnosis Date  . Allergy   . Asthma    Medications-  Current Outpatient Prescriptions on File Prior to Visit  Medication Sig Dispense Refill  . azelastine (ASTELIN) 137 MCG/SPRAY nasal spray USE 1-2 SPRAYS IN EACH NOSTRIL TWICE A DAY 30 mL 6  . fluticasone (FLONASE) 50 MCG/ACT nasal spray USE 1 SPRAY IN EACH NOSTRIL TWICE A DAY 16 g 6  . HYDROcodone-acetaminophen (NORCO) 5-325 MG per tablet Take 1 tablet by mouth every 6 (six) hours as needed for moderate pain. (Patient not taking: Reported on 11/23/2014) 60 tablet 0   No current facility-administered medications on file prior to visit.   Allergies-  Allergies  Allergen Reactions  . Codeine     Headache   Physical Exam BP 122/64 mmHg  Pulse 90  Temp(Src) 99.8 F (37.7 C) (Temporal)  Resp 18  Ht 6' 1.5" (1.867 m)  Wt 220 lb (99.791 kg)  BMI 28.63 kg/m2  SpO2 98% Wt Readings from Last 3 Encounters:  11/23/14 220 lb  (99.791 kg)  05/04/14 208 lb 9.6 oz (94.62 kg)  02/24/14 211 lb (95.709 kg)  Vitals Reviewed.   General Appearance: Well nourished, in no apparent distress and had pleasant demeanor.  Sickly appearance. Eyes:  PERRLA. EOMI. Conjunctiva is pink without edema, erythema or yellowing. No scleral icterus. Sinuses: Mild maxillary tenderness bilaterally upon palpation.  No Frontal tenderness. Ears: No erythema, edema or tenderness on both external ear cartilages and ear canals.  TM on left is erythematous, injected and non-edematous without bulging.   TM on right is bulging with clear fluid and normal light reflex without erythema or edema. Nose: Nose is symmetrical and turbinates are erythematous and edematous bilaterally.    Rhinorrhea present and no polyps. Throat: Oral pharynx is pink and moist.  Erythema in pharynx and posterior pharynx without edema.  Mucosa is intact and without lesions.  Tonsils are at +1 station bilaterally and do not have exudate.    Uvula is midline and not swollen. Neck: Supple, LAD, Trachea is midline. Full range of motion in neck intact Respiratory: Chest wall expansion symmetrical.  CTAB,  r/r/w or stridor.  No increased effort of breathing. Cardio: RRR.   m/r/g.  S1S2nl.   Abdomen: Symmetrical, soft, nontender, and flat.  +BS nl x4.   Extremities:  C/C/E in upper and lower extremities.  Pulses B/L +2 Skin: Warm, dry without rashes, lesions, ecchymosis, yellowing, cyanosis.  Neuro: Alert and oriented X3, cooperative.  Mood  and affect appropriate to situation.  CN II-XII grossly intact. Normal gait. Psych: Insight and Judgment appropriate.   Assessment and Plan 1. Acute bronchitis, unspecified organism/ Acute Maxillary Sinusitis -Take Z-Pak as prescribed- azithromycin (ZITHROMAX) 250 MG tablet; Take 2 tablets PO on day 1, then 1 tablet PO Q24H x 4 days  Dispense: 6 tablet; Refill: 1 -Take Promethazine-DM as prescribed for cough- promethazine-dextromethorphan  (PROMETHAZINE-DM) 6.25-15 MG/5ML syrup; Take 5 mLs by mouth 4 (four) times daily as needed for cough.  Dispense: 180 mL; Refill: 0 -Take Prednisone as prescribed for inflammation- predniSONE (DELTASONE) 20 MG tablet; Take 3 tablets PO QDaily for 3 days, then take 2 tablets PO QDaily for 3 days, then take 1 tablet PO QDaily for 3 days  Dispense: 18 tablet; Refill: 0 -Had free coupon for Proair Respiclick for SOB- Albuterol Sulfate (PROAIR RESPICLICK) 378 (90 BASE) MCG/ACT AEPB; Inhale 2 puffs into the lungs every 6 (six) hours as needed.  Dispense: 1 each; Refill: 1  Discussed medication effects and SE's.  Pt agreed to treatment plan. If you are not feeling better in 10-14 days, then please call the office. Please keep your physical appt on 05/05/15.  Yuleimy Kretz, Stephani Police, PA-C 1:44 PM [facility]

## 2014-11-23 NOTE — Patient Instructions (Signed)
-Take Z-Pak as prescribed -Take Prednisone as prescribed for inflammation -Take Promethazine-DM as prescribed for cough. -Take Proair Respiclick as prescribed for wheezing/SOB.  If you are not feeling better in 10-14 days, then please call the office.    Acute Bronchitis Bronchitis is inflammation of the airways that extend from the windpipe into the lungs (bronchi). The inflammation often causes mucus to develop. This leads to a cough, which is the most common symptom of bronchitis.  In acute bronchitis, the condition usually develops suddenly and goes away over time, usually in a couple weeks. Smoking, allergies, and asthma can make bronchitis worse. Repeated episodes of bronchitis may cause further lung problems.  CAUSES Acute bronchitis is most often caused by the same virus that causes a cold. The virus can spread from person to person (contagious) through coughing, sneezing, and touching contaminated objects. SIGNS AND SYMPTOMS   Cough.   Fever.   Coughing up mucus.   Body aches.   Chest congestion.   Chills.   Shortness of breath.   Sore throat.  DIAGNOSIS  Acute bronchitis is usually diagnosed through a physical exam. Your health care provider will also ask you questions about your medical history. Tests, such as chest X-rays, are sometimes done to rule out other conditions.  TREATMENT  Acute bronchitis usually goes away in a couple weeks. Oftentimes, no medical treatment is necessary. Medicines are sometimes given for relief of fever or cough. Antibiotic medicines are usually not needed but may be prescribed in certain situations. In some cases, an inhaler may be recommended to help reduce shortness of breath and control the cough. A cool mist vaporizer may also be used to help thin bronchial secretions and make it easier to clear the chest.  HOME CARE INSTRUCTIONS  Get plenty of rest.   Drink enough fluids to keep your urine clear or pale yellow (unless you  have a medical condition that requires fluid restriction). Increasing fluids may help thin your respiratory secretions (sputum) and reduce chest congestion, and it will prevent dehydration.   Take medicines only as directed by your health care provider.  If you were prescribed an antibiotic medicine, finish it all even if you start to feel better.  Avoid smoking and secondhand smoke. Exposure to cigarette smoke or irritating chemicals will make bronchitis worse. If you are a smoker, consider using nicotine gum or skin patches to help control withdrawal symptoms. Quitting smoking will help your lungs heal faster.   Reduce the chances of another bout of acute bronchitis by washing your hands frequently, avoiding people with cold symptoms, and trying not to touch your hands to your mouth, nose, or eyes.   Keep all follow-up visits as directed by your health care provider.  SEEK MEDICAL CARE IF: Your symptoms do not improve after 1 week of treatment.  SEEK IMMEDIATE MEDICAL CARE IF:  You develop an increased fever or chills.   You have chest pain.   You have severe shortness of breath.  You have bloody sputum.   You develop dehydration.  You faint or repeatedly feel like you are going to pass out.  You develop repeated vomiting.  You develop a severe headache. MAKE SURE YOU:   Understand these instructions.  Will watch your condition.  Will get help right away if you are not doing well or get worse. Document Released: 12/07/2004 Document Revised: 03/16/2014 Document Reviewed: 04/22/2013 [facility] Patient Information 2015 [place], [place]. This information is not intended to replace advice given to you by  your health care provider. Make sure you discuss any questions you have with your health care provider.  

## 2015-05-05 ENCOUNTER — Encounter: Payer: Self-pay | Admitting: Internal Medicine

## 2015-05-05 ENCOUNTER — Other Ambulatory Visit: Payer: Self-pay | Admitting: *Deleted

## 2015-05-05 ENCOUNTER — Ambulatory Visit (INDEPENDENT_AMBULATORY_CARE_PROVIDER_SITE_OTHER): Payer: BLUE CROSS/BLUE SHIELD | Admitting: Internal Medicine

## 2015-05-05 VITALS — BP 114/74 | HR 60 | Temp 97.3°F | Resp 16 | Ht 73.5 in | Wt 212.4 lb

## 2015-05-05 DIAGNOSIS — R030 Elevated blood-pressure reading, without diagnosis of hypertension: Secondary | ICD-10-CM

## 2015-05-05 DIAGNOSIS — E559 Vitamin D deficiency, unspecified: Secondary | ICD-10-CM

## 2015-05-05 DIAGNOSIS — IMO0001 Reserved for inherently not codable concepts without codable children: Secondary | ICD-10-CM

## 2015-05-05 DIAGNOSIS — E7800 Pure hypercholesterolemia, unspecified: Secondary | ICD-10-CM

## 2015-05-05 DIAGNOSIS — Z79899 Other long term (current) drug therapy: Secondary | ICD-10-CM

## 2015-05-05 DIAGNOSIS — R5383 Other fatigue: Secondary | ICD-10-CM

## 2015-05-05 DIAGNOSIS — Z111 Encounter for screening for respiratory tuberculosis: Secondary | ICD-10-CM

## 2015-05-05 DIAGNOSIS — Z1212 Encounter for screening for malignant neoplasm of rectum: Secondary | ICD-10-CM

## 2015-05-05 DIAGNOSIS — Z6827 Body mass index (BMI) 27.0-27.9, adult: Secondary | ICD-10-CM

## 2015-05-05 DIAGNOSIS — Z125 Encounter for screening for malignant neoplasm of prostate: Secondary | ICD-10-CM

## 2015-05-05 DIAGNOSIS — R7309 Other abnormal glucose: Secondary | ICD-10-CM

## 2015-05-05 DIAGNOSIS — J4520 Mild intermittent asthma, uncomplicated: Secondary | ICD-10-CM

## 2015-05-05 MED ORDER — AZELASTINE HCL 0.1 % NA SOLN: NASAL | Status: DC

## 2015-05-05 MED ORDER — FLUTICASONE PROPIONATE 50 MCG/ACT NA SUSP: 1.0000 | Freq: Two times a day (BID) | NASAL | Status: DC

## 2015-05-05 NOTE — Patient Instructions (Signed)

## 2015-05-05 NOTE — Progress Notes (Signed)
Patient ID: Bradley Roy, male   DOB: [birth year], 44 y.o.   MRN: [id number]  Annual Comprehensive Examination  This very nice 44 y.o. MWM presents for complete physical.  Patient has been followed expectantly for elevated BP, glucose and does have mild Hyperlipidemia  and Vitamin D Deficiency.   Patient's BP has been monitored expectantly. Today's BP: 114/74 mmHg. Patient denies any cardiac symptoms as chest pain, palpitations, shortness of breath, dizziness or ankle swelling.   Patient's hyperlipidemia is controlled with diet and medications. Patient denies myalgias or other medication SE's. Last lipids were near goal as below: Lab Results  Component Value Date   CHOL 176 05/04/2014   HDL 52 05/04/2014   LDLCALC 107* 05/04/2014   TRIG 83 05/04/2014   CHOLHDL 3.4 05/04/2014    Patient is screened for  prediabetes  and patient denies reactive hypoglycemic symptoms, visual blurring, diabetic polys or paresthesias. Last A1c was 4.8% in June 2015.   Finally, patient has history of Vitamin D Deficiency of 33 in 2008 and last vitamin D was 47 in June 2015.  Medication Sig  . PROAIR RESPICLICK   Inhale 2 puffs into the lungs every 6 (six) hours as needed.  . NORCO 5-325 MG Take 1 tablet by mouth every 6 (six) hours as needed for moderate pain.  . ASTELIN 137 MCG/SPRAY nasal spray USE 1-2 SPRAYS IN EACH NOSTRIL TWICE A DAY  . fluticasone   nasal spray USE 1 SPRAY IN EACH NOSTRIL TWICE A DAY   Allergies  Allergen Reactions  . Codeine     Headache   Past Medical History  Diagnosis Date  . Allergy   . Asthma    Health Maintenance  Topic Date Due  . INFLUENZA VACCINE  06/14/2015  . TETANUS/TDAP  05/04/2024  . HIV Screening  Completed   Immunization History  Administered Date(s) Administered  . PPD Test 05/04/2014, 05/05/2015  . Tdap 05/04/2014   Past Surgical History  Procedure Laterality Date  . Wrist surgery Left   . Knee arthroscopy Right 2011  . Finger surgery Right      middle finger   No family history on file. History   Social History  . Marital Status: Married    Spouse Name: N/A  . Number of Children: N/A  . Years of Education: N/A   Occupational History  . Supervisor now of a crew of [employer]   Social History Main Topics  . Smoking status: Former Smoker    Quit date: 11/23/1992  . Smokeless tobacco: Not on file  . Alcohol Use: 0.0 oz/week    0 Standard drinks or equivalent per week     Comment: very rarely  . Drug Use: No  . Sexual Activity: Active      ROS Constitutional: Denies fever, chills, weight loss/gain, headaches, insomnia,  night sweats or change in appetite. Does c/o fatigue. Eyes: Denies redness, blurred vision, diplopia, discharge, itchy or watery eyes.  ENT: Denies discharge, congestion, post nasal drip, epistaxis, sore throat, earache, hearing loss, dental pain, Tinnitus, Vertigo, Sinus pain or snoring.  Cardio: Denies chest pain, palpitations, irregular heartbeat, syncope, dyspnea, diaphoresis, orthopnea, PND, claudication or edema Respiratory: denies cough, dyspnea, DOE, pleurisy, hoarseness, laryngitis or wheezing.  Gastrointestinal: Denies dysphagia, heartburn, reflux, water brash, pain, cramps, nausea, vomiting, bloating, diarrhea, constipation, hematemesis, melena, hematochezia, jaundice or hemorrhoids Genitourinary: Denies dysuria, frequency, urgency, nocturia, hesitancy, discharge, hematuria or flank pain Musculoskeletal: Denies arthralgia, myalgia, stiffness, Jt. Swelling, pain, limp or strain/sprain. Denies Falls. Skin:  Denies puritis, rash, hives, warts, acne, eczema or change in skin lesion Neuro: No weakness, tremor, incoordination, spasms, paresthesia or pain Psychiatric: Denies confusion, memory loss or sensory loss. Denies Depression. Endocrine: Denies change in weight, skin, hair change, nocturia, and paresthesia, diabetic polys, visual blurring or hyper / hypo glycemic episodes.  Heme/Lymph: No excessive  bleeding, bruising or enlarged lymph nodes.  Physical Exam  BP 114/74   Pulse 60  Temp 97.3 F   Resp 16  Ht 6' 1.5"   Wt 212 lb 6.4 oz    BMI 27.64   General Appearance: Well nourished, in no apparent distress. Eyes: PERRLA, EOMs, conjunctiva no swelling or erythema, normal fundi and vessels. Sinuses: No frontal/maxillary tenderness ENT/Mouth: EACs patent / TMs  nl. Nares clear without erythema, swelling, mucoid exudates. Oral hygiene is good. No erythema, swelling, or exudate. Tongue normal, non-obstructing. Tonsils not swollen or erythematous. Hearing normal.  Neck: Supple, thyroid normal. No bruits, nodes or JVD. Respiratory: Respiratory effort normal.  BS equal and clear bilateral without rales, rhonci, wheezing or stridor. Cardio: Heart sounds are normal with regular rate and rhythm and no murmurs, rubs or gallops. Peripheral pulses are normal and equal bilaterally without edema. No aortic or femoral bruits. Chest: symmetric with normal excursions and percussion.  Abdomen: Flat, soft, with bowel sounds. Nontender, no guarding, rebound, hernias, masses, or organomegaly.  Lymphatics: Non tender without lymphadenopathy.  Genitourinary: No hernias.Testes nl. DRE - prostate nl for age - smooth & firm w/o nodules. Musculoskeletal: Full ROM all peripheral extremities, joint stability, 5/5 strength, and normal gait. Skin: Warm and dry without rashes, lesions, cyanosis, clubbing or  ecchymosis.  Neuro: Cranial nerves intact, reflexes equal bilaterally. Normal muscle tone, no cerebellar symptoms. Sensation intact.  Pysch: Awake and oriented X 3 with normal affect, insight and judgment appropriate.   Assessment and Plan  1. Elevated BP  - Microalbumin / creatinine urine ratio - EKG 12-Lead  2. Vitamin D deficiency  - Lipid panel  3. Elevated cholesterol   4. Abnormal glucose  - Hemoglobin A1c - Insulin, random  5. Asthma, mild intermittent, uncomplicated  - Vit D  25  hydroxy (rtn osteoporosis monitoring)  6. Screening for rectal cancer  - POC Hemoccult Bld/Stl (3-Cd Home Screen); Future  7. Screening for prostate cancer  - PSA  8. Other fatigue  - Vitamin B12 - Testosterone - Iron and TIBC - TSH  9. Screening examination for pulmonary tuberculosis  - PPD  10. Medication management  - Urine Microscopic - CBC with Differential/Platelet - BASIC METABOLIC PANEL WITH GFR - Hepatic function panel - Magnesium      Continue prudent diet as discussed, weight control, BP monitoring, regular exercise, and medications as discussed.  Discussed med effects and SE's. Routine screening labs and tests as requested with regular follow-up as recommended.  Over 40 minutes of exam, counseling &  chart review was performed

## 2015-05-06 LAB — CBC WITH DIFFERENTIAL/PLATELET
BASOS PCT: 1 % (ref 0–1)
Basophils Absolute: 0.1 10*3/uL (ref 0.0–0.1)
Eosinophils Absolute: 0.1 10*3/uL (ref 0.0–0.7)
Eosinophils Relative: 2 % (ref 0–5)
HEMATOCRIT: 43 % (ref 39.0–52.0)
Hemoglobin: 14.8 g/dL (ref 13.0–17.0)
Lymphocytes Relative: 35 % (ref 12–46)
Lymphs Abs: 2 10*3/uL (ref 0.7–4.0)
MCH: 30.6 pg (ref 26.0–34.0)
MCHC: 34.4 g/dL (ref 30.0–36.0)
MCV: 88.8 fL (ref 78.0–100.0)
MONOS PCT: 7 % (ref 3–12)
MPV: 9 fL (ref 8.6–12.4)
Monocytes Absolute: 0.4 10*3/uL (ref 0.1–1.0)
NEUTROS ABS: 3.1 10*3/uL (ref 1.7–7.7)
Neutrophils Relative %: 55 % (ref 43–77)
PLATELETS: 266 10*3/uL (ref 150–400)
RBC: 4.84 MIL/uL (ref 4.22–5.81)
RDW: 13.7 % (ref 11.5–15.5)
WBC: 5.6 10*3/uL (ref 4.0–10.5)

## 2015-05-06 LAB — IRON AND TIBC
%SAT: 30 % (ref 20–55)
Iron: 93 ug/dL (ref 42–165)
TIBC: 309 ug/dL (ref 215–435)
UIBC: 216 ug/dL (ref 125–400)

## 2015-05-06 LAB — BASIC METABOLIC PANEL WITH GFR
BUN: 20 mg/dL (ref 6–23)
CO2: 29 meq/L (ref 19–32)
Calcium: 9.3 mg/dL (ref 8.4–10.5)
Chloride: 107 mEq/L (ref 96–112)
Creat: 1.16 mg/dL (ref 0.50–1.35)
GFR, Est Non African American: 78 mL/min
Glucose, Bld: 72 mg/dL (ref 70–99)
POTASSIUM: 4.5 meq/L (ref 3.5–5.3)
Sodium: 144 mEq/L (ref 135–145)

## 2015-05-06 LAB — URINALYSIS, MICROSCOPIC ONLY
BACTERIA UA: NONE SEEN
Casts: NONE SEEN
Crystals: NONE SEEN
Squamous Epithelial / LPF: NONE SEEN

## 2015-05-06 LAB — HEMOGLOBIN A1C
HEMOGLOBIN A1C: 4.8 % (ref <5.7)
MEAN PLASMA GLUCOSE: 91 mg/dL (ref <117)

## 2015-05-06 LAB — MAGNESIUM: MAGNESIUM: 1.9 mg/dL (ref 1.5–2.5)

## 2015-05-06 LAB — LIPID PANEL
CHOLESTEROL: 185 mg/dL (ref 0–200)
HDL: 43 mg/dL (ref >=40)
LDL Cholesterol: 118 mg/dL — ABNORMAL HIGH (ref 0–99)
TRIGLYCERIDES: 120 mg/dL (ref <150)
Total CHOL/HDL Ratio: 4.3 Ratio
VLDL: 24 mg/dL (ref 0–40)

## 2015-05-06 LAB — HEPATIC FUNCTION PANEL
ALBUMIN: 4.2 g/dL (ref 3.5–5.2)
ALT: 12 U/L (ref 0–53)
AST: 15 U/L (ref 0–37)
Alkaline Phosphatase: 50 U/L (ref 39–117)
BILIRUBIN TOTAL: 1 mg/dL (ref 0.2–1.2)
Bilirubin, Direct: 0.2 mg/dL (ref 0.0–0.3)
Indirect Bilirubin: 0.8 mg/dL (ref 0.2–1.2)
Total Protein: 6.6 g/dL (ref 6.0–8.3)

## 2015-05-06 LAB — INSULIN, RANDOM: Insulin: 5.6 u[IU]/mL (ref 2.0–19.6)

## 2015-05-06 LAB — TESTOSTERONE: Testosterone: 511 ng/dL (ref 300–890)

## 2015-05-06 LAB — TSH: TSH: 0.855 u[IU]/mL (ref 0.350–4.500)

## 2015-05-06 LAB — VITAMIN B12: VITAMIN B 12: 608 pg/mL (ref 211–911)

## 2015-05-06 LAB — PSA: PSA: 0.64 ng/mL (ref <=4.00)

## 2015-05-06 LAB — MICROALBUMIN / CREATININE URINE RATIO
Creatinine, Urine: 176.2 mg/dL
Microalb Creat Ratio: 1.7 mg/g (ref 0.0–30.0)
Microalb, Ur: 0.3 mg/dL (ref <2.0)

## 2015-05-06 LAB — VITAMIN D 25 HYDROXY (VIT D DEFICIENCY, FRACTURES): VIT D 25 HYDROXY: 35 ng/mL (ref 30–100)

## 2015-05-10 LAB — TB SKIN TEST
Induration: 0 mm
TB Skin Test: NEGATIVE

## 2015-11-02 ENCOUNTER — Ambulatory Visit: Payer: Commercial Managed Care - HMO | Admitting: Internal Medicine

## 2015-11-02 ENCOUNTER — Encounter: Payer: Self-pay | Admitting: Internal Medicine

## 2015-11-02 VITALS — BP 126/62 | HR 56 | Temp 98.2°F | Resp 16 | Ht 73.5 in | Wt 225.0 lb

## 2015-11-02 DIAGNOSIS — J209 Acute bronchitis, unspecified: Secondary | ICD-10-CM

## 2015-11-02 MED ORDER — ALBUTEROL SULFATE HFA 108 (90 BASE) MCG/ACT IN AERS: 1.0000 | INHALATION_SPRAY | Freq: Four times a day (QID) | RESPIRATORY_TRACT | Status: DC | PRN

## 2015-11-02 MED ORDER — AZITHROMYCIN 250 MG PO TABS: ORAL_TABLET | ORAL | Status: DC

## 2015-11-02 MED ORDER — PREDNISONE 20 MG PO TABS: ORAL_TABLET | ORAL | Status: DC

## 2015-11-02 NOTE — Patient Instructions (Addendum)
Please start using saline as often as you can tolerate it.pre  Please take claritin, zyrtec, or allegra daily. Store brand and generic okay.  Get whatever is on sale.  Go back to using astelin and flonase daily until this resolves.  Finish prednisone.  Please take zpak until all the way gone.    Use albuterol as needed for wheezing and cough.

## 2015-11-02 NOTE — Progress Notes (Signed)
Patient ID: Bradley Roy, male   DOB: Mar 31, 1971, 44 y.o.   MRN: [id number]  HPI  Patient presents to the office for evaluation of cough.  It has been going on for 1 weeks.  Patient reports night > day, dry, worse with lying down.  They also endorse change in voice, postnasal drip and chest tightness, wheezing, nasal congestion, post nasal drip. .  They have tried tylenol.  They report that nothing has worked.  They denies other sick contacts.  Review of Systems  Constitutional: Positive for chills and malaise/fatigue. Negative for fever.  HENT: Positive for congestion and sore throat. Negative for ear pain.   Eyes: Positive for discharge (watery).  Respiratory: Positive for cough, shortness of breath and wheezing.   Cardiovascular: Negative for chest pain, palpitations and leg swelling.  Neurological: Negative for headaches.     PE:  Filed Vitals:   11/02/15 0854  BP: 126/62  Pulse: 56  Temp: 98.2 F (36.8 C)  Resp: 16    General:  Alert and non-toxic, WDWN, NAD HEENT: NCAT, PERLA, EOM normal, no occular discharge or erythema.  Nasal mucosal edema with sinus tenderness to palpation.  Oropharynx clear with minimal oropharyngeal edema and erythema.  Mucous membranes moist and pink. Neck:  Cervical adenopathy Chest:  RRR no MRGs.  Lungs clear to auscultation A&P with no wheezes rhonchi or rales.   Abdomen: +BS x 4 quadrants, soft, non-tender, no guarding, rigidity, or rebound. Skin: warm and dry no rash Neuro: A&Ox4, CN II-XII grossly intact  Assessment and Plan:   1. Acute bronchitis, unspecified organism -zyrtec -saline -flonase -asteline -zpak -prednisone -albuterol

## 2016-01-04 ENCOUNTER — Ambulatory Visit (INDEPENDENT_AMBULATORY_CARE_PROVIDER_SITE_OTHER): Payer: Commercial Managed Care - HMO | Admitting: Physician Assistant

## 2016-01-04 VITALS — BP 108/70 | HR 82 | Temp 97.9°F | Resp 16 | Ht 73.5 in | Wt 223.0 lb

## 2016-01-04 DIAGNOSIS — J4520 Mild intermittent asthma, uncomplicated: Secondary | ICD-10-CM | POA: Diagnosis not present

## 2016-01-04 DIAGNOSIS — J209 Acute bronchitis, unspecified: Secondary | ICD-10-CM

## 2016-01-04 DIAGNOSIS — R509 Fever, unspecified: Secondary | ICD-10-CM | POA: Diagnosis not present

## 2016-01-04 DIAGNOSIS — J329 Chronic sinusitis, unspecified: Secondary | ICD-10-CM | POA: Insufficient documentation

## 2016-01-04 LAB — CBC WITH DIFFERENTIAL/PLATELET
Basophils Absolute: 0 10*3/uL (ref 0.0–0.1)
Basophils Relative: 1 % (ref 0–1)
Eosinophils Absolute: 0.1 10*3/uL (ref 0.0–0.7)
Eosinophils Relative: 3 % (ref 0–5)
HCT: 41.7 % (ref 39.0–52.0)
Hemoglobin: 14.4 g/dL (ref 13.0–17.0)
Lymphocytes Relative: 18 % (ref 12–46)
Lymphs Abs: 0.7 10*3/uL (ref 0.7–4.0)
MCH: 30.1 pg (ref 26.0–34.0)
MCHC: 34.5 g/dL (ref 30.0–36.0)
MCV: 87.2 fL (ref 78.0–100.0)
MPV: 9.7 fL (ref 8.6–12.4)
Monocytes Absolute: 0.7 10*3/uL (ref 0.1–1.0)
Monocytes Relative: 17 % — ABNORMAL HIGH (ref 3–12)
Neutro Abs: 2.4 10*3/uL (ref 1.7–7.7)
Neutrophils Relative %: 61 % (ref 43–77)
Platelets: 187 10*3/uL (ref 150–400)
RBC: 4.78 MIL/uL (ref 4.22–5.81)
RDW: 13.1 % (ref 11.5–15.5)
WBC: 3.9 10*3/uL — ABNORMAL LOW (ref 4.0–10.5)

## 2016-01-04 LAB — COMPREHENSIVE METABOLIC PANEL
ALK PHOS: 42 U/L (ref 40–115)
ALT: 16 U/L (ref 9–46)
AST: 21 U/L (ref 10–40)
Albumin: 4.1 g/dL (ref 3.6–5.1)
BILIRUBIN TOTAL: 1.1 mg/dL (ref 0.2–1.2)
BUN: 13 mg/dL (ref 7–25)
CO2: 26 mmol/L (ref 20–31)
CREATININE: 1.21 mg/dL (ref 0.60–1.35)
Calcium: 8.6 mg/dL (ref 8.6–10.3)
Chloride: 104 mmol/L (ref 98–110)
GLUCOSE: 96 mg/dL (ref 65–99)
Potassium: 4.4 mmol/L (ref 3.5–5.3)
SODIUM: 137 mmol/L (ref 135–146)
TOTAL PROTEIN: 6.5 g/dL (ref 6.1–8.1)

## 2016-01-04 MED ORDER — PREDNISONE 20 MG PO TABS: ORAL_TABLET | ORAL | Status: DC

## 2016-01-04 MED ORDER — AZITHROMYCIN 250 MG PO TABS: ORAL_TABLET | ORAL | Status: AC

## 2016-01-04 MED ORDER — PROMETHAZINE HCL 25 MG PO TABS: 25.0000 mg | ORAL_TABLET | Freq: Four times a day (QID) | ORAL | Status: DC | PRN

## 2016-01-04 NOTE — Progress Notes (Signed)
   Subjective:    Patient ID: Bradley Roy, male    DOB: Apr 24, 1971, 45 y.o.   MRN: [id number]  HPI 45 y.o. WM with history of asthma presents with fever. States progressed rapidly yesterday at lunch with fever, chills, sinus congestion, sore throat, cough productive, muscle aches, headache, nausea. No sick contacts, did not get flu shot.   Blood pressure 108/70, pulse 82, temperature 97.9 F (36.6 C), resp. rate 16, height 6' 1.5" (1.867 m), weight 223 lb (101.152 kg), SpO2 97 %.  Past Medical History  Diagnosis Date  . Allergy   . Asthma    Current Outpatient Prescriptions on File Prior to Visit  Medication Sig Dispense Refill  . albuterol (PROVENTIL HFA;VENTOLIN HFA) 108 (90 BASE) MCG/ACT inhaler Inhale 1-2 puffs into the lungs every 6 (six) hours as needed for wheezing or shortness of breath (cough). 1 Inhaler 0  . Albuterol Sulfate (PROAIR RESPICLICK) 123XX123 (90 BASE) MCG/ACT AEPB Inhale 2 puffs into the lungs every 6 (six) hours as needed. 1 each 1  . azelastine (ASTELIN) 0.1 % nasal spray USE 1-2 SPRAYS IN EACH NOSTRIL TWICE A DAY 30 mL 6  . fluticasone (FLONASE) 50 MCG/ACT nasal spray Place 1 spray into both nostrils 2 (two) times daily. 16 g 6  . HYDROcodone-acetaminophen (NORCO) 5-325 MG per tablet Take 1 tablet by mouth every 6 (six) hours as needed for moderate pain. 60 tablet 0   No current facility-administered medications on file prior to visit.    Review of Systems  Constitutional: Negative for fever, chills and diaphoresis.  HENT: Positive for congestion, postnasal drip, rhinorrhea, sinus pressure and sore throat. Negative for ear pain, sneezing, trouble swallowing and voice change.   Eyes: Negative.   Respiratory: Positive for cough, shortness of breath and wheezing. Negative for chest tightness.   Cardiovascular: Negative.   Gastrointestinal: Negative.   Genitourinary: Negative.   Musculoskeletal: Negative.  Negative for neck pain.  Neurological: Negative.   Negative for headaches.       Objective:   Physical Exam  Constitutional: He is oriented to person, place, and time. He appears well-developed and well-nourished.  HENT:  Head: Normocephalic and atraumatic.  Right Ear: External ear normal.  Left Ear: External ear normal.  Nose: Nose normal.  Mouth/Throat: Oropharynx is clear and moist.  Eyes: Conjunctivae are normal. Pupils are equal, round, and reactive to light.  Neck: Normal range of motion. Neck supple.  Cardiovascular: Normal rate, regular rhythm and normal heart sounds.   No murmur heard. Pulmonary/Chest: Effort normal. No respiratory distress. He has wheezes. He has no rales. He exhibits no tenderness.  Abdominal: Soft. Bowel sounds are normal.  Lymphadenopathy:    He has no cervical adenopathy.  Neurological: He is alert and oriented to person, place, and time.  Skin: Skin is warm and dry.       Assessment & Plan:   1. Acute bronchitis/fever with history of Asthma, mild intermittent, uncomplicated ? Flu with rapid onset and fever, will get CBC Prednisone, albuterol inhaler, allegra, and hold zpak.  - CBC with Differential/Platelet - Comprehensive metabolic panel

## 2016-01-04 NOTE — Patient Instructions (Signed)
Please take the prednisone to help decrease inflammation and therefore decrease symptoms. Take it it with food to avoid GI upset. It can cause increased energy but on the other hand it can make it hard to sleep at night so please take it AT [place], it takes 8-12 hours to start working so it will NOT affect your sleeping if you take it at night with your food!!  If you are diabetic it will increase your sugars so decrease carbs and monitor your sugars closely.    Influenza, Adult Influenza ("the flu") is a viral infection of the respiratory tract. It occurs more often in winter months because people spend more time in close contact with one another. Influenza can make you feel very sick. Influenza easily spreads from person to person (contagious). CAUSES  Influenza is caused by a virus that infects the respiratory tract. You can catch the virus by breathing in droplets from an infected person's cough or sneeze. You can also catch the virus by touching something that was recently contaminated with the virus and then touching your mouth, nose, or eyes. RISKS AND COMPLICATIONS You may be at risk for a more severe case of influenza if you smoke cigarettes, have diabetes, have chronic heart disease (such as heart failure) or lung disease (such as asthma), or if you have a weakened immune system. Elderly people and pregnant women are also at risk for more serious infections. The most common problem of influenza is a lung infection (pneumonia). Sometimes, this problem can require emergency medical care and may be life threatening. SIGNS AND SYMPTOMS  Symptoms typically last 4 to 10 days and may include:  Fever.  Chills.  Headache, body aches, and muscle aches.  Sore throat.  Chest discomfort and cough.  Poor appetite.  Weakness or feeling tired.  Dizziness.  Nausea or vomiting. DIAGNOSIS  Diagnosis of influenza is often made based on your history and a physical exam. A nose or throat  swab test can be done to confirm the diagnosis. TREATMENT  In mild cases, influenza goes away on its own. Treatment is directed at relieving symptoms. For more severe cases, your health care provider may prescribe antiviral medicines to shorten the sickness. Antibiotic medicines are not effective because the infection is caused by a virus, not by bacteria. HOME CARE INSTRUCTIONS  Take medicines only as directed by your health care provider.  Use a cool mist humidifier to make breathing easier.  Get plenty of rest until your temperature returns to normal. This usually takes 3 to 4 days.  Drink enough fluid to keep your urine clear or pale yellow.  Cover yourmouth and nosewhen coughing or sneezing,and wash your handswellto prevent thevirusfrom spreading.  Stay homefromwork orschool untilthe fever is gonefor at least 31full day. PREVENTION  An annual influenza vaccination (flu shot) is the best way to avoid getting influenza. An annual flu shot is now routinely recommended for all adults in the [place] IF:  You experiencechest pain, yourcough worsens,or you producemore mucus.  Youhave nausea,vomiting, ordiarrhea.  Your fever returns or gets worse. SEEK IMMEDIATE MEDICAL CARE IF:  You havetrouble breathing, you become short of breath,or your skin ornails becomebluish.  You have severe painor stiffnessin the neck.  You develop a sudden headache, or pain in the face or ear.  You have nausea or vomiting that you cannot control. MAKE SURE YOU:   Understand these instructions.  Will watch your condition.  Will get help right away if  you are not doing well or get worse.   This information is not intended to replace advice given to you by your health care provider. Make sure you discuss any questions you have with your health care provider.   Document Released: 10/27/2000 Document Revised: 11/20/2014 Document Reviewed: 01/29/2012 Elsevier  Interactive Patient Education [employer].

## 2016-04-02 ENCOUNTER — Emergency Department (HOSPITAL_BASED_OUTPATIENT_CLINIC_OR_DEPARTMENT_OTHER): Payer: Commercial Managed Care - HMO

## 2016-04-02 ENCOUNTER — Emergency Department (HOSPITAL_BASED_OUTPATIENT_CLINIC_OR_DEPARTMENT_OTHER)
Admission: EM | Admit: 2016-04-02 | Discharge: 2016-04-03 | Disposition: A | Discharge status: Home / Self Care | Payer: Commercial Managed Care - HMO | Attending: Emergency Medicine | Admitting: Emergency Medicine

## 2016-04-02 ENCOUNTER — Encounter (HOSPITAL_BASED_OUTPATIENT_CLINIC_OR_DEPARTMENT_OTHER): Payer: Self-pay

## 2016-04-02 DIAGNOSIS — J45909 Unspecified asthma, uncomplicated: Secondary | ICD-10-CM | POA: Diagnosis not present

## 2016-04-02 DIAGNOSIS — Y929 Unspecified place or not applicable: Secondary | ICD-10-CM | POA: Diagnosis not present

## 2016-04-02 DIAGNOSIS — Y999 Unspecified external cause status: Secondary | ICD-10-CM | POA: Diagnosis not present

## 2016-04-02 DIAGNOSIS — Z87891 Personal history of nicotine dependence: Secondary | ICD-10-CM | POA: Insufficient documentation

## 2016-04-02 DIAGNOSIS — W231XXA Caught, crushed, jammed, or pinched between stationary objects, initial encounter: Secondary | ICD-10-CM | POA: Diagnosis not present

## 2016-04-02 DIAGNOSIS — S6991XA Unspecified injury of right wrist, hand and finger(s), initial encounter: Secondary | ICD-10-CM | POA: Diagnosis present

## 2016-04-02 DIAGNOSIS — S6391XA Sprain of unspecified part of right wrist and hand, initial encounter: Secondary | ICD-10-CM | POA: Diagnosis not present

## 2016-04-02 DIAGNOSIS — Y939 Activity, unspecified: Secondary | ICD-10-CM | POA: Insufficient documentation

## 2016-04-02 MED ORDER — NAPROXEN 500 MG PO TABS: 500.0000 mg | ORAL_TABLET | Freq: Two times a day (BID) | ORAL | Status: DC

## 2016-04-02 NOTE — ED Notes (Signed)
Care assumed at time of d/c. Pt seen by EDP prior to RN assessment, see MD notes, orders received to splint fingers and d/c. R 2nd and 3rd digit/ hand swelling noted, scant bruising. Denies pain at this time when at rest. Denies numbness tingling weakness or other sx. Skin intact. Cap refill <2sec. CMS intact. ROM limited d/t pain/swelling.

## 2016-04-02 NOTE — Discharge Instructions (Signed)
Intermetacarpal Sprain The intermetacarpal ligaments run between the knuckles at the base of the fingers. These ligaments are vulnerable to sprain and injury in which the ligament becomes overstretched or torn. Intermetacarpal sprains are classified into 3 categories. Grade 1 sprains cause pain, but the tendon is not lengthened. Grade 2 sprains include a lengthened ligament, due to the ligament being stretched or partially ruptured. With grade 2 sprains there is still function, although function may be decreased. Grade 3 sprains include a complete tear of the ligament, and the joint usually displays a loss of function.  SYMPTOMS   Severe pain at the time of injury.  Often, a feeling of popping or tearing inside the hand.  Tenderness and inflammation at the knuckles.  Bruising within a couple days of injury.  Impaired ability to use the hand. CAUSES  This condition occurs when the intermetacarpal ligaments are subjected to a greater stress than they can handle. This causes the ligaments to become stretched or torn. RISK INCREASES WITH:  Previous hand injury.  Fighting sports (boxing, wrestling, martial arts).  Sports in which you could fall on an outstretched hand (soccer, basketball, volleyball).  Other sports with repeated hand trauma (water polo, gymnastics).  Poor hand strength and flexibility.  Inadequate or poorly fitted protective equipment. PREVENTION   Warm up and stretch properly before activity.  Maintain appropriate conditioning:  Hand flexibility.  Muscle strength and endurance.  Applying tape, protective strapping, or a brace may help prevent injury.  Provide the hand with support during sports and practice activities for 6 to 12 months following injury. PROGNOSIS  With proper treatment, healing should occur without impairment. The length of healing varies from 2 to 12 weeks, depending on the severity of injury. RELATED COMPLICATIONS   Longer healing time, if  activities are resumed too soon.  Recurring symptoms or repeated injury, resulting in a chronic problem.  Injury to other nearby structures (bone, cartilage, tendon).  Arthritis of the knuckle (intermetacarpal) joint, with repeated sprains.  Prolonged disability (sometimes).  Hand and finger stiffness or weakness. TREATMENT Treatment first involves ice and medicine to reduce pain and inflammation. An elastic compression bandage may be worn to reduce discomfort and to protect the area. Depending on the severity of injury, you may be required to restrain the area with a cast, splint, or brace. After the ligament has been allowed to heal, strengthening and stretching exercises may be needed to regain strength and a full range of motion. Exercises may be completed at home or with a therapist. Surgery is rarely needed. MEDICATION   If pain medicine is needed, nonsteroidal anti-inflammatory medicines (aspirin and ibuprofen), or other minor pain relievers (acetaminophen), are often advised.  Do not take pain medicine for 7 days before surgery.  Stronger pain relievers may be prescribed if your caregiver thinks they are needed. Use only as directed and only as much as you need. HEAT AND COLD  Cold treatment (icing) should be applied for 10 to 15 minutes every 2 to 3 hours for inflammation and pain, and immediately after activity that aggravates your symptoms. Use ice packs or an ice massage.  Heat treatment may be used before performing stretching and strengthening activities prescribed by your caregiver, physical therapist, or athletic trainer. Use a heat pack or a warm water soak. SEEK MEDICAL CARE IF:   Symptoms remain or get worse, despite treatment for longer than 2 to 4 weeks.  You experience pain, numbness, discoloration, or coldness in the hand or fingers.  You develop blue, gray, or dark fingernails. °· Any of the following occur after surgery: increased pain, swelling, redness,  drainage of fluids, bleeding in the affected area, or signs of infection, including fever. °· New, unexplained symptoms develop. (Drugs used in treatment may produce side effects.) °  °This information is not intended to replace advice given to you by your health care provider. Make sure you discuss any questions you have with your health care provider. °  °Document Released: 10/30/2005 Document Revised: 11/20/2014 Document Reviewed: 04/19/2015 °Elsevier Interactive Patient Education ©2016 Elsevier Inc. ° °

## 2016-04-02 NOTE — ED Notes (Signed)
Right hand pain after getting it caught in pickets. Swelling and redness noted.

## 2016-04-02 NOTE — ED Provider Notes (Signed)
CSN: [id number]     Arrival date & time 04/02/16  2100 History   First MD Initiated Contact with Patient 04/02/16 2337     Chief Complaint  Patient presents with  . Hand Injury     (Consider location/radiation/quality/duration/timing/severity/associated sxs/prior Treatment) HPI Patient fell such that his first and second fingers got spread apart on [place] of a fence. He reports that he has a lot of pain right between those 2 fingers and on the polyps hand. There was no cut associated. He reports he one to make sure that he didn't have any broken bones. No other associated injury. Past Medical History  Diagnosis Date  . Allergy   . Asthma    Past Surgical History  Procedure Laterality Date  . Wrist surgery Left   . Knee arthroscopy Right 2011  . Finger surgery Right     middle finger   No family history on file. Social History  Substance Use Topics  . Smoking status: Former Smoker    Quit date: 11/23/1992  . Smokeless tobacco: None  . Alcohol Use: 0.0 oz/week    0 Standard drinks or equivalent per week     Comment: very rarely    Review of Systems  Constitutional: No recent fevers or chills  Allergies  Codeine  Home Medications   Prior to Admission medications   Medication Sig Start Date End Date Taking? Authorizing Provider  albuterol (PROVENTIL HFA;VENTOLIN HFA) 108 (90 BASE) MCG/ACT inhaler Inhale 1-2 puffs into the lungs every 6 (six) hours as needed for wheezing or shortness of breath (cough). 11/02/15   Courtney Forcucci, PA-C  Albuterol Sulfate (PROAIR RESPICLICK) 123XX123 (90 BASE) MCG/ACT AEPB Inhale 2 puffs into the lungs every 6 (six) hours as needed. 11/23/14   Jennifer Couillard, PA-C  azelastine (ASTELIN) 0.1 % nasal spray USE 1-2 SPRAYS IN EACH NOSTRIL TWICE A DAY 05/05/15   Unk Pinto, MD  fluticasone [facility]) 50 MCG/ACT nasal spray Place 1 spray into both nostrils 2 (two) times daily. 05/05/15   Unk Pinto, MD  HYDROcodone-acetaminophen (NORCO)  5-325 MG per tablet Take 1 tablet by mouth every 6 (six) hours as needed for moderate pain. 02/24/14   Vicie Mutters, PA-C  naproxen (NAPROSYN) 500 MG tablet Take 1 tablet (500 mg total) by mouth 2 (two) times daily. 04/02/16   Charlesetta Shanks, MD  predniSONE (DELTASONE) 20 MG tablet 2 tablets daily for 3 days, 1 tablet daily for 4 days. 01/04/16   Vicie Mutters, PA-C  promethazine (PHENERGAN) 25 MG tablet Take 1 tablet (25 mg total) by mouth every 6 (six) hours as needed for nausea or vomiting. 01/04/16   Vicie Mutters, PA-C   BP 132/80 mmHg  Pulse 68  Temp(Src) 98.2 F (36.8 C) (Oral)  Resp 16  Ht 6' (1.829 m)  Wt 225 lb (102.059 kg)  BMI 30.51 kg/m2 Physical Exam  Constitutional: He appears well-developed and well-nourished. No distress.  HENT:  Head: Normocephalic and atraumatic.  Pulmonary/Chest: Effort normal.  Musculoskeletal: Normal range of motion. He exhibits tenderness.  Right hand is grossly normal appearance. There is no deformity present. There is no laceration or abrasion. Patient does have intact flexion and extension of his digits against resistance. He however has pain and the palmar aspect between the first and second metacarpals and over the metacarpal heads. Wrist is nontender.  Neurological: He is alert. Coordination normal.  Skin: Skin is warm and dry.  Psychiatric: He has a normal mood and affect.    ED Course  Procedures (including critical care time) Labs Review Labs Reviewed - No data to display  Imaging Review Dg Hand Complete Right  04/02/2016  CLINICAL DATA:  Right hand pain after injury.  Redness and swelling. EXAM: RIGHT HAND - COMPLETE 3+ VIEW COMPARISON:  None. FINDINGS: There is no evidence of fracture or dislocation. Mild proliferative change at the thumb carpal metacarpal joint. No localizing soft tissue abnormality. IMPRESSION: No fracture or dislocation of the right hand. Electronically Signed   By: Jeb Levering M.D.   On: 04/02/2016 22:30    I have personally reviewed and evaluated these images and lab results as part of my medical decision-making.   EKG Interpretation None      MDM   Final diagnoses:  Hand sprain, right, initial encounter   No fracture identified. No disruption. Findings most suggestive of a sprain between the second and third metacarpal joints. Buddy taping will be placed and instructions for icing and elevating. Patient is instructed to first follow-up with his primary care physician for reassessment. He is advised that if there is residual dysfunction, he may require referral to a hand specialist.    Charlesetta Shanks, MD 04/02/16 [phone]

## 2016-04-03 NOTE — ED Notes (Signed)
Pt given d/c instructions as per chart. Verbalizes understanding. No questions. 

## 2016-05-04 ENCOUNTER — Ambulatory Visit (INDEPENDENT_AMBULATORY_CARE_PROVIDER_SITE_OTHER): Payer: Commercial Managed Care - HMO | Admitting: Internal Medicine

## 2016-05-04 ENCOUNTER — Encounter: Payer: Self-pay | Admitting: Internal Medicine

## 2016-05-04 VITALS — BP 126/72 | HR 60 | Temp 97.3°F | Resp 16 | Ht 74.0 in | Wt 216.4 lb

## 2016-05-04 DIAGNOSIS — E559 Vitamin D deficiency, unspecified: Secondary | ICD-10-CM

## 2016-05-04 DIAGNOSIS — R5383 Other fatigue: Secondary | ICD-10-CM

## 2016-05-04 DIAGNOSIS — Z79899 Other long term (current) drug therapy: Secondary | ICD-10-CM

## 2016-05-04 DIAGNOSIS — Z0001 Encounter for general adult medical examination with abnormal findings: Secondary | ICD-10-CM

## 2016-05-04 DIAGNOSIS — Z125 Encounter for screening for malignant neoplasm of prostate: Secondary | ICD-10-CM

## 2016-05-04 DIAGNOSIS — IMO0001 Reserved for inherently not codable concepts without codable children: Secondary | ICD-10-CM

## 2016-05-04 DIAGNOSIS — E785 Hyperlipidemia, unspecified: Secondary | ICD-10-CM | POA: Insufficient documentation

## 2016-05-04 DIAGNOSIS — Z136 Encounter for screening for cardiovascular disorders: Secondary | ICD-10-CM | POA: Diagnosis not present

## 2016-05-04 DIAGNOSIS — Z111 Encounter for screening for respiratory tuberculosis: Secondary | ICD-10-CM | POA: Diagnosis not present

## 2016-05-04 DIAGNOSIS — R030 Elevated blood-pressure reading, without diagnosis of hypertension: Secondary | ICD-10-CM | POA: Diagnosis not present

## 2016-05-04 DIAGNOSIS — R6889 Other general symptoms and signs: Secondary | ICD-10-CM | POA: Diagnosis not present

## 2016-05-04 DIAGNOSIS — Z1212 Encounter for screening for malignant neoplasm of rectum: Secondary | ICD-10-CM

## 2016-05-04 DIAGNOSIS — R7309 Other abnormal glucose: Secondary | ICD-10-CM

## 2016-05-04 LAB — BASIC METABOLIC PANEL WITH GFR
BUN: 18 mg/dL (ref 7–25)
CALCIUM: 9.1 mg/dL (ref 8.6–10.3)
CO2: 24 mmol/L (ref 20–31)
CREATININE: 1.24 mg/dL (ref 0.60–1.35)
Chloride: 104 mmol/L (ref 98–110)
GFR, EST AFRICAN AMERICAN: 82 mL/min (ref >=60)
GFR, EST NON AFRICAN AMERICAN: 71 mL/min (ref >=60)
Glucose, Bld: 83 mg/dL (ref 65–99)
Potassium: 4.1 mmol/L (ref 3.5–5.3)
SODIUM: 139 mmol/L (ref 135–146)

## 2016-05-04 LAB — CBC WITH DIFFERENTIAL/PLATELET
BASOS ABS: 0 {cells}/uL (ref 0–200)
Basophils Relative: 0 %
EOS PCT: 3 %
Eosinophils Absolute: 156 cells/uL (ref 15–500)
HCT: 42.2 % (ref 38.5–50.0)
Hemoglobin: 14.7 g/dL (ref 13.2–17.1)
Lymphocytes Relative: 34 %
Lymphs Abs: 1768 cells/uL (ref 850–3900)
MCH: 30.4 pg (ref 27.0–33.0)
MCHC: 34.8 g/dL (ref 32.0–36.0)
MCV: 87.4 fL (ref 80.0–100.0)
MONOS PCT: 8 %
MPV: 9.5 fL (ref 7.5–12.5)
Monocytes Absolute: 416 cells/uL (ref 200–950)
NEUTROS ABS: 2860 {cells}/uL (ref 1500–7800)
NEUTROS PCT: 55 %
PLATELETS: 233 10*3/uL (ref 140–400)
RBC: 4.83 MIL/uL (ref 4.20–5.80)
RDW: 13.7 % (ref 11.0–15.0)
WBC: 5.2 10*3/uL (ref 3.8–10.8)

## 2016-05-04 LAB — HEPATIC FUNCTION PANEL
ALT: 12 U/L (ref 9–46)
AST: 16 U/L (ref 10–40)
Albumin: 4.3 g/dL (ref 3.6–5.1)
Alkaline Phosphatase: 54 U/L (ref 40–115)
BILIRUBIN DIRECT: 0.2 mg/dL (ref <=0.2)
BILIRUBIN TOTAL: 1.2 mg/dL (ref 0.2–1.2)
Indirect Bilirubin: 1 mg/dL (ref 0.2–1.2)
Total Protein: 6.5 g/dL (ref 6.1–8.1)

## 2016-05-04 LAB — IRON AND TIBC
%SAT: 22 % (ref 15–60)
IRON: 68 ug/dL (ref 50–180)
TIBC: 309 ug/dL (ref 250–425)
UIBC: 241 ug/dL (ref 125–400)

## 2016-05-04 LAB — MAGNESIUM: MAGNESIUM: 2 mg/dL (ref 1.5–2.5)

## 2016-05-04 LAB — LIPID PANEL
CHOL/HDL RATIO: 3.6 ratio (ref <=5.0)
CHOLESTEROL: 174 mg/dL (ref 125–200)
HDL: 49 mg/dL (ref >=40)
LDL Cholesterol: 100 mg/dL (ref <130)
TRIGLYCERIDES: 125 mg/dL (ref <150)
VLDL: 25 mg/dL (ref <30)

## 2016-05-04 LAB — TSH: TSH: 1.17 mIU/L (ref 0.40–4.50)

## 2016-05-04 LAB — HEMOGLOBIN A1C
HEMOGLOBIN A1C: 4.7 % (ref <5.7)
Mean Plasma Glucose: 88 mg/dL

## 2016-05-04 LAB — VITAMIN B12: Vitamin B-12: 648 pg/mL (ref 200–1100)

## 2016-05-04 NOTE — Patient Instructions (Signed)

## 2016-05-04 NOTE — Progress Notes (Signed)
Patient ID: Bradley Roy, male   DOB: 29-Jul-1971, 45 y.o.   MRN: [id number]  [facility] ADULT & ADOLESCENT INTERNAL MEDICINE   Unk Pinto, M.D.    Uvaldo Bristle. Silverio Lay, P.A.-C      Starlyn Skeans, P.A.-C   [facility]                [address] [place], [place] [id number] Telephone [phone] Telefax [phone] _________________________________  Annual  Screening/Preventative Visit And Comprehensive Evaluation & Examination     This very nice 45 y.o. MWM presents for a Wellness/Preventative Visit & comprehensive evaluation and management of multiple medical co-morbidities.  Patient has been followed for HTN, Prediabetes, Hyperlipidemia and Vitamin D Deficiency. Patient does have hx/o asthma which has been quiescent and also seasonal allergic rhinitis.      Patient has hx/o mildly elevated  or labile HTN predating  since  2007, 2010 & 2014 . Patient's BP has been controlled at home. Today's BP: 126/72 mmHg. Patient denies any cardiac symptoms as chest pain, palpitations, shortness of breath, dizziness or ankle swelling.     Patient's hyperlipidemia is not controlled with diet.  Last lipids were  Not at goal with Cholesterol 185;  HDL 43;  LDL 118*;   Triglycerides 120 on 05/05/2015.     Patient is screened proactively for prediabetes  and patient denies reactive hypoglycemic symptoms, visual blurring, diabetic polys or paresthesias. Last A1c was  4.8% on 05/04/42.       Finally, patient has history of Vitamin D Deficiency of   "10" in 2008  and last vitamin D was still low at  35 on 05/05/2015.   Medication Sig  . Albuterol  HFA inhaler Inhale 1-2 puffs into the lungs every 6 (six) hours as needed for wheezing or shortness of breath (cough).  . Albuterol / PROAIR RESPICLICK Inhale 2 puffs into the lungs every 6 (six) hours as needed.  . ASTELIN nasal spray USE 1-2 SPRAYS IN EACH NOSTRIL TWICE A DAY  . FLONASE nasal spray Place 1  spray into both nostrils 2 (two) times daily.  Lebron Quam 5-325 MG per tablet Take 1 tablet by mouth every 6 (six) hours as needed for moderate pain.   Allergies  Allergen Reactions  . Codeine     Headache   Past Medical History  Diagnosis Date  . Allergy   . Asthma    Health Maintenance  Topic Date Due  . INFLUENZA VACCINE  06/13/2016  . TETANUS/TDAP  05/04/2024  . HIV Screening  Completed   Immunization History  Administered Date(s) Administered  . PPD Test 05/04/2014, 05/05/2015  . Tdap 05/04/2014   Past Surgical History  Procedure Laterality Date  . Wrist surgery Left   . Knee arthroscopy Right 2011  . Finger surgery Right     middle finger   Social History   Social History  . Marital Status: Married    Spouse Name: N/A  . Number of Children: 2 step daughters   Occupational History  . [employer]   Social History Main Topics  . Smoking status: Former Smoker    Quit date: 11/23/1992  . Smokeless tobacco: Not on file  . Alcohol Use: 0.0 oz/week    0 Standard drinks or equivalent per week     Comment: very rarely  . Drug Use: No  . Sexual Activity: Not on file  ROS Constitutional: Denies fever, chills, weight loss/gain, headaches, insomnia,  night sweats or change in appetite. Does c/o  occasional fatigue. Eyes: Denies redness, blurred vision, diplopia, discharge, itchy or watery eyes.  ENT: Denies discharge, congestion, post nasal drip, epistaxis, sore throat, earache, hearing loss, dental pain, Tinnitus, Vertigo, Sinus pain or snoring.  Cardio: Denies chest pain, palpitations, irregular heartbeat, syncope, dyspnea, diaphoresis, orthopnea, PND, claudication or edema Respiratory: denies cough, dyspnea, DOE, pleurisy, hoarseness, laryngitis or wheezing.  Gastrointestinal: Denies dysphagia, heartburn, reflux, water brash, pain, cramps, nausea, vomiting, bloating, diarrhea, constipation, hematemesis, melena, hematochezia, jaundice or  hemorrhoids Genitourinary: Denies dysuria, frequency, urgency, nocturia, hesitancy, discharge, hematuria or flank pain Musculoskeletal: Denies arthralgia, myalgia, stiffness, Jt. Swelling, pain, limp or strain/sprain. Denies Falls. Skin: Denies puritis, rash, hives, warts, acne, eczema or change in skin lesion Neuro: No weakness, tremor, incoordination, spasms, paresthesia or pain Psychiatric: Denies confusion, memory loss or sensory loss. Denies Depression. Endocrine: Denies change in weight, skin, hair change, nocturia, and paresthesia, diabetic polys, visual blurring or hyper / hypo glycemic episodes.  Heme/Lymph: No excessive bleeding, bruising or enlarged lymph nodes.  Physical Exam  BP 126/72 mmHg  Pulse 60  Temp(Src) 97.3 F (36.3 C)  Resp 16  Ht 6\' 2"  (1.88 m)  Wt 216 lb 6.4 oz (98.158 kg)  BMI 27.77 kg/m2  General Appearance: Well nourished, in no apparent distress. Eyes: PERRLA, EOMs, conjunctiva no swelling or erythema, normal fundi and vessels. Sinuses: No frontal/maxillary tenderness ENT/Mouth: EACs patent / TMs  nl. Nares clear without erythema, swelling, mucoid exudates. Oral hygiene is good. No erythema, swelling, or exudate. Tongue normal, non-obstructing. Tonsils not swollen or erythematous. Hearing normal.  Neck: Supple, thyroid normal. No bruits, nodes or JVD. Respiratory: Respiratory effort normal.  BS equal and clear bilateral without rales, rhonci, wheezing or stridor. Cardio: Heart sounds are normal with regular rate and rhythm and no murmurs, rubs or gallops. Peripheral pulses are normal and equal bilaterally without edema. No aortic or femoral bruits. Chest: symmetric with normal excursions and percussion.  Abdomen: Soft, with Nl bowel sounds. Nontender, no guarding, rebound, hernias, masses, or organomegaly.  Lymphatics: Non tender without lymphadenopathy.  Genitourinary: No hernias.Testes nl. DRE - prostate nl for age - smooth & firm w/o  nodules. Musculoskeletal: Full ROM all peripheral extremities, joint stability, 5/5 strength, and normal gait. Skin: Warm and dry without rashes, lesions, cyanosis, clubbing or  ecchymosis.  Neuro: Cranial nerves intact, reflexes equal bilaterally. Normal muscle tone, no cerebellar symptoms. Sensation intact.  Pysch: Alert and oriented X 3 with normal affect, insight and judgment appropriate.   Assessment and Plan  1. Annual Preventative/Screening Exam    2. Elevated BP  - Microalbumin / creatinine urine ratio - EKG 12-Lead - TSH  3. Hyperlipidemia  - Lipid panel - TSH  4. Other abnormal glucose  - Hemoglobin A1c - Insulin, random  5. Vitamin D deficiency  - VITAMIN D 25 Hydroxy   6. Screening for rectal cancer  - POC Hemoccult Bld/Stl   7. Prostate cancer screening   8. Other fatigue  - Vitamin B12 - Iron and TIBC - Testosterone - CBC with Differential/Platelet - TSH  9. Medication management  - Urinalysis, Routine w reflex microscopic  - CBC with Differential/Platelet - BASIC METABOLIC PANEL WITH GFR - Hepatic function panel - Magnesium   Continue prudent diet as discussed, weight control, BP monitoring, regular exercise, and medications as discussed.  Discussed med effects and SE's. Routine screening labs and tests as requested with  regular follow-up as recommended. Over 40 minutes of exam, counseling, chart review and high complex critical decision making was performed

## 2016-05-05 LAB — URINALYSIS, ROUTINE W REFLEX MICROSCOPIC
Bilirubin Urine: NEGATIVE
Glucose, UA: NEGATIVE
HGB URINE DIPSTICK: NEGATIVE
KETONES UR: NEGATIVE
LEUKOCYTES UA: NEGATIVE
NITRITE: NEGATIVE
PROTEIN: NEGATIVE
Specific Gravity, Urine: 1.021 (ref 1.001–1.035)
pH: 6 (ref 5.0–8.0)

## 2016-05-05 LAB — INSULIN, RANDOM: Insulin: 8.1 u[IU]/mL (ref 2.0–19.6)

## 2016-05-05 LAB — VITAMIN D 25 HYDROXY (VIT D DEFICIENCY, FRACTURES): VIT D 25 HYDROXY: 41 ng/mL (ref 30–100)

## 2016-05-05 LAB — MICROALBUMIN / CREATININE URINE RATIO
Creatinine, Urine: 174 mg/dL (ref 20–370)
MICROALB UR: 0.3 mg/dL
Microalb Creat Ratio: 2 mcg/mg creat (ref <30)

## 2016-05-05 LAB — TESTOSTERONE: TESTOSTERONE: 443 ng/dL (ref 250–827)

## 2016-05-08 LAB — TB SKIN TEST
Induration: 0 mm
TB Skin Test: NEGATIVE

## 2016-06-04 ENCOUNTER — Emergency Department (HOSPITAL_COMMUNITY)
Admission: EM | Admit: 2016-06-04 | Discharge: 2016-06-04 | Disposition: A | Discharge status: Home / Self Care | Payer: Commercial Managed Care - HMO | Attending: Emergency Medicine | Admitting: Emergency Medicine

## 2016-06-04 ENCOUNTER — Encounter (HOSPITAL_COMMUNITY): Payer: Self-pay

## 2016-06-04 ENCOUNTER — Emergency Department (HOSPITAL_COMMUNITY): Payer: Commercial Managed Care - HMO

## 2016-06-04 DIAGNOSIS — F17290 Nicotine dependence, other tobacco product, uncomplicated: Secondary | ICD-10-CM | POA: Insufficient documentation

## 2016-06-04 DIAGNOSIS — J45909 Unspecified asthma, uncomplicated: Secondary | ICD-10-CM | POA: Diagnosis not present

## 2016-06-04 DIAGNOSIS — Y9289 Other specified places as the place of occurrence of the external cause: Secondary | ICD-10-CM | POA: Diagnosis not present

## 2016-06-04 DIAGNOSIS — M62838 Other muscle spasm: Secondary | ICD-10-CM | POA: Insufficient documentation

## 2016-06-04 DIAGNOSIS — Y999 Unspecified external cause status: Secondary | ICD-10-CM | POA: Insufficient documentation

## 2016-06-04 DIAGNOSIS — Z79899 Other long term (current) drug therapy: Secondary | ICD-10-CM | POA: Insufficient documentation

## 2016-06-04 DIAGNOSIS — E785 Hyperlipidemia, unspecified: Secondary | ICD-10-CM | POA: Diagnosis not present

## 2016-06-04 DIAGNOSIS — S59911A Unspecified injury of right forearm, initial encounter: Secondary | ICD-10-CM | POA: Diagnosis present

## 2016-06-04 DIAGNOSIS — S52501A Unspecified fracture of the lower end of right radius, initial encounter for closed fracture: Secondary | ICD-10-CM | POA: Insufficient documentation

## 2016-06-04 DIAGNOSIS — M542 Cervicalgia: Secondary | ICD-10-CM

## 2016-06-04 DIAGNOSIS — Z7951 Long term (current) use of inhaled steroids: Secondary | ICD-10-CM | POA: Insufficient documentation

## 2016-06-04 DIAGNOSIS — Y9355 Activity, bike riding: Secondary | ICD-10-CM | POA: Insufficient documentation

## 2016-06-04 MED ORDER — TETANUS-DIPHTH-ACELL PERTUSSIS 5-2.5-18.5 LF-MCG/0.5 IM SUSP
0.5000 mL | Freq: Once | INTRAMUSCULAR | Status: AC
  Administered 2016-06-04: 0.5 mL via INTRAMUSCULAR
  Filled 2016-06-04: qty 0.5

## 2016-06-04 MED ORDER — CYCLOBENZAPRINE HCL 10 MG PO TABS: 10.0000 mg | ORAL_TABLET | Freq: Three times a day (TID) | ORAL | 0 refills | Status: DC | PRN

## 2016-06-04 MED ORDER — HYDROCODONE-ACETAMINOPHEN 5-325 MG PO TABS
1.0000 | ORAL_TABLET | Freq: Four times a day (QID) | ORAL | 0 refills | Status: DC | PRN
Start: 2016-06-04 — End: 2016-11-20

## 2016-06-04 MED ORDER — NAPROXEN 500 MG PO TABS: 500.0000 mg | ORAL_TABLET | Freq: Two times a day (BID) | ORAL | 0 refills | Status: DC | PRN
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
## 2016-06-04 NOTE — Discharge Instructions (Signed)
Take naprosyn as directed for inflammation and pain with norco for breakthrough pain and flexeril for muscle relaxation. Do not drive or operate machinery with pain medication or muscle relaxant use. Wear wrist splint at all times until you see the orthopedist. Ice and elevate wrist throughout the day, using ice pack for no more than 20 minutes every hour.  Use heat to other areas of soreness, expect to be sore for the next few days. Follow up with your wrist surgeon for ongoing management of your wrist fracture in 5-7 days. Return to the ER for changes or worsening symptoms.
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
## 2016-07-20 ENCOUNTER — Other Ambulatory Visit: Payer: Self-pay | Admitting: *Deleted

## 2016-07-20 MED ORDER — FLUTICASONE PROPIONATE 50 MCG/ACT NA SUSP: 1.0000 | Freq: Two times a day (BID) | NASAL | 6 refills | Status: DC

## 2016-07-20 MED ORDER — AZELASTINE HCL 0.1 % NA SOLN: NASAL | 6 refills | Status: DC

## 2016-11-20 ENCOUNTER — Ambulatory Visit (INDEPENDENT_AMBULATORY_CARE_PROVIDER_SITE_OTHER): Payer: Managed Care, Other (non HMO) | Admitting: Internal Medicine

## 2016-11-20 ENCOUNTER — Encounter: Payer: Self-pay | Admitting: Internal Medicine

## 2016-11-20 VITALS — BP 128/84 | HR 72 | Temp 97.3°F | Resp 16 | Ht 74.0 in | Wt 226.6 lb

## 2016-11-20 DIAGNOSIS — K6289 Other specified diseases of anus and rectum: Secondary | ICD-10-CM

## 2016-11-20 DIAGNOSIS — N320 Bladder-neck obstruction: Secondary | ICD-10-CM

## 2016-11-20 NOTE — Progress Notes (Signed)
[facility] [place] ADULT & ADOLESCENT INTERNAL MEDICINE   Unk Pinto, M.D.    Uvaldo Bristle. Silverio Lay, P.A.-C      Starlyn Skeans, P.A.-C  [facility]                [address] [place], [place] [id number] Telephone [phone] Telefax [phone]  Subjective:    Patient ID: Bradley Roy, male    DOB: 1971-04-04, 46 y.o.   MRN: [id number]  HPI  This very nice 46 yo MWM with hx/o mild labile HTN, HLD and Vit D Deficiency presents with a 44 month hx/o of sensation of constant pressure in his rectal and perineal area and sensation of constipation of a need to have a BM which is not relieved after evacuation. He denies any melena, hematochezia or mucoid stools and BM's are reported bulky and not particularly firm. He reports the rectal discomfort is worse with the need to urinate, but is not relieved with urination. He denies any LUTS as hesitancy, urgency, dribbling or dysuria.   Medication Sig  . PROAIR RESPICLICK Inhale 2 puffs into the lungs every 6 (six) hours as needed.  . ASTELIN 0.1 % nasal USE 1-2 SPRAYS IN EACH NOSTRIL TWICE A DAY  . FLONASE nasal  Place 1 spray into both nostrils 2 (two) times daily.  . NORCO 5-325 MG per Take 1 tab every 6  hours as needed for moderate pain.  . naproxen  500 MG  Take 1 tab 2  times daily as needed for mild pain  . Cyclobenzaprine 10 MG  Take 1 tab 3  times daily as needed for muscle spasms.   Allergies  Allergen Reactions  . Codeine     Headache   Past Medical History:  Diagnosis Date  . Allergy   . Asthma    Past Surgical History:  Procedure Laterality Date  . FINGER SURGERY Right    middle finger  . KNEE ARTHROSCOPY Right 2011  . WRIST SURGERY Left    Review of Systems  10 point systems review negative except as above.    Objective:   Physical Exam  BP 128/84   Pulse 72   Temp 97.3 F (36.3 C)   Resp 16   Ht 6\' 2"  (1.88 m)   Wt 226 lb 9.6 oz (102.8 kg)   BMI 29.09 kg/m    HEENT - Eac's patent. TM's Nl. EOM's full. PERRLA. NasoOroPharynx clear. Neck - supple. Nl Thyroid. Carotids 2+ & No bruits, nodes, JVD Chest - Clear equal BS w/o Rales, rhonchi, wheezes. Cor - Nl HS. RRR w/o sig M. Abd - No palpable organomegaly, masses or tenderness. BS nl. DRE- No EH, fissures or tags. Nl sphincter tone and prostate Nl. No palpable rectal masses. MS- FROM w/o deformities. Gait Nl. Neuro -  Nl w/o focal abnormalities.    Assessment & Plan:   1. Rectal discomfort  - Ambulatory referral to Gastroenterology for consideration for Anoscopy  2. Bladder neck obstruction  - PSA - Urinalysis, Routine w reflex microscopic - Urine culture

## 2016-11-21 ENCOUNTER — Other Ambulatory Visit: Payer: Self-pay | Admitting: Internal Medicine

## 2016-11-21 LAB — URINE CULTURE: Organism ID, Bacteria: NO GROWTH

## 2016-11-21 LAB — URINALYSIS, ROUTINE W REFLEX MICROSCOPIC
Bilirubin Urine: NEGATIVE
GLUCOSE, UA: NEGATIVE
Hgb urine dipstick: NEGATIVE
Ketones, ur: NEGATIVE
LEUKOCYTES UA: NEGATIVE
NITRITE: NEGATIVE
PH: 6 (ref 5.0–8.0)
Protein, ur: NEGATIVE
SPECIFIC GRAVITY, URINE: 1.021 (ref 1.001–1.035)

## 2016-11-21 LAB — PSA: PSA: 0.6 ng/mL (ref <=4.0)

## 2016-11-22 ENCOUNTER — Encounter: Payer: Self-pay | Admitting: Gastroenterology

## 2016-12-21 ENCOUNTER — Ambulatory Visit: Payer: Commercial Managed Care - HMO | Admitting: Gastroenterology

## 2017-03-06 ENCOUNTER — Ambulatory Visit (INDEPENDENT_AMBULATORY_CARE_PROVIDER_SITE_OTHER): Payer: Managed Care, Other (non HMO) | Admitting: Physician Assistant

## 2017-03-06 ENCOUNTER — Encounter: Payer: Self-pay | Admitting: Physician Assistant

## 2017-03-06 ENCOUNTER — Encounter (INDEPENDENT_AMBULATORY_CARE_PROVIDER_SITE_OTHER): Payer: Self-pay

## 2017-03-06 VITALS — BP 130/88 | HR 80 | Wt 222.0 lb

## 2017-03-06 DIAGNOSIS — K625 Hemorrhage of anus and rectum: Secondary | ICD-10-CM | POA: Diagnosis not present

## 2017-03-06 DIAGNOSIS — K5909 Other constipation: Secondary | ICD-10-CM | POA: Diagnosis not present

## 2017-03-06 DIAGNOSIS — K6289 Other specified diseases of anus and rectum: Secondary | ICD-10-CM | POA: Diagnosis not present

## 2017-03-06 NOTE — Patient Instructions (Addendum)
We have given you a high fiber diet handout. Please strive to have 25-30 grams of fiber daily.   Please purchase the following medications over the counter and take as directed: Preparation H suppositories twice a day when experiencing rectal discomfort.

## 2017-03-06 NOTE — Progress Notes (Addendum)
Chief Complaint: Rectal Discomfort, Rectal Bleeding, Constipation  HPI:  Bradley Roy is a 46 year old Caucasian male with a past medical history as listed below,  who was referred to me by Unk Pinto, MD for a complaint of rectal discomfort and bleeding with constipation.     Today, the patient explains that about 4 months ago he developed a constant rectal pressure which felt like he had to "go to the bathroom", and felt "raw inside". He noted that around this time he is also very constipated finding it hard to pass bowel movements and having to strain. This occurred for about 5-6 weeks and he did see a little bright red blood on the toilet paper after wiping on a couple of occasions during this time. He did go see his PCP who did a Hemoccult which was negative and a rectal exam during which "he didn't feel anything" per the patient. This then went away until about 3 weeks ago the patient began experiencing further rectal pressure and did see a small amount of bright red blood on the toilet paper after wiping with a bowel movement on 2 occasions, this has since ceased and he has not seen any bleeding in the past 3 weeks and has not had any discomfort. He also notes that he was somewhat constipated around that time.   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 He has also gained around 19 pounds per his recollection.   Patient has positive family history of colon cancer in his grandfather diagnosed in his 33s.   Patient denies fever, chills, weight loss, anorexia, melena, abdominal pain, nausea, vomiting, heartburn, reflux or symptoms that awaken him at night.  Past Medical History:  Diagnosis Date  . Allergy   . Asthma     Past Surgical History:  Procedure Laterality Date  . FINGER SURGERY Right    middle finger  . KNEE ARTHROSCOPY Right 2011  . WRIST  SURGERY Left     Current Outpatient Prescriptions  Medication Sig Dispense Refill  . albuterol (PROVENTIL HFA;VENTOLIN HFA) 108 (90 BASE) MCG/ACT inhaler Inhale 1-2 puffs into the lungs every 6 (six) hours as needed for wheezing or shortness of breath (cough). 1 Inhaler 0  . Albuterol Sulfate (PROAIR RESPICLICK) 093 (90 BASE) MCG/ACT AEPB Inhale 2 puffs into the lungs every 6 (six) hours as needed. 1 each 1  . azelastine (ASTELIN) 0.1 % nasal spray USE 1-2 SPRAYS IN EACH NOSTRIL TWICE A DAY 30 mL 6  . fluticasone (FLONASE) 50 MCG/ACT nasal spray Place 1 spray into both nostrils 2 (two) times daily. 16 g 6  . HYDROcodone-acetaminophen (NORCO) 5-325 MG per tablet Take 1 tablet by mouth every 6 (six) hours as needed for moderate pain. 60 tablet 0  . naproxen (NAPROSYN) 500 MG tablet Take 1 tablet (500 mg total) by mouth 2 (two) times daily as needed for mild pain, moderate pain or headache (TAKE WITH MEALS.). 20 tablet 0   No current facility-administered medications for this visit.     Allergies as of 03/06/2017 - Review Complete 03/06/2017  Allergen Reaction Noted  . Codeine  02/22/2014    Family History  Problem Relation Age of Onset  . Colon cancer Paternal Grandfather     Social History   Social History  . Marital status: Married    Spouse name: N/A  . Number of children:  N/A  . Years of education: N/A   Occupational History  . Not on file.   Social History Main Topics  . Smoking status: Former Smoker    Quit date: 11/23/1992  . Smokeless tobacco: Current User  . Alcohol use 0.0 oz/week     Comment: very rarely  . Drug use: No  . Sexual activity: Not on file   Other Topics Concern  . Not on file   Social History Narrative  . No narrative on file    Review of Systems:    Constitutional: No weight loss, fever or chills Skin: No rash  Cardiovascular: No chest pain Respiratory: No SOB Gastrointestinal: See HPI and otherwise negative Genitourinary: No  dysuria Neurological: No headache Musculoskeletal: No new muscle or joint pain Hematologic: No bruising Psychiatric: No history of depression or anxiety   Physical Exam:  Vital signs: BP 130/88   Pulse 80   Wt 222 lb (100.7 kg)   BMI 28.50 kg/m   Constitutional:   Pleasant Caucasian male appears to be in NAD, Well developed, Well nourished, alert and cooperative Head:  Normocephalic and atraumatic. Eyes:   PEERL, EOMI. No icterus. Conjunctiva pink. Ears:  Normal auditory acuity. Neck:  Supple Throat: Oral cavity and pharynx without inflammation, swelling or lesion.  Respiratory: Respirations even and unlabored. Lungs clear to auscultation bilaterally.   No wheezes, crackles, or rhonchi.  Cardiovascular: Normal S1, S2. No MRG. Regular rate and rhythm. No peripheral edema, cyanosis or pallor.  Gastrointestinal:  Soft, nondistended, nontender. No rebound or guarding. Normal bowel sounds. No appreciable masses or hepatomegaly. Rectal:  Not performed. (patient declined) Msk:  Symmetrical without gross deformities. Without edema, no deformity or joint abnormality.  Neurologic:  Alert and  oriented x4;  grossly normal neurologically.  Skin:   Dry and intact without significant lesions or rashes. Psychiatric:  Demonstrates good judgement and reason without abnormal affect or behaviors.  No recent labs  Assessment: 1. Rectal discomfort: Patient reports a pressure during these episodes which occurred twice over the past 4 months, none in 3 weeks; likely related to internal hemorrhoids; patient declines rectal exam today and is aware of delayed diagnosis of things such as rectal cancer 2. Rectal bleeding: Only a very small amount on the toilet paper when wiping for a bowel movement during times of constipation and rectal discomfort; most likely related to internal hemorrhoids as PCPs rectal exam was negative recently 3. Constipation: Patient notes periods of constipation when all these  symptoms start, likely related to decrease in activity level since changing positions at work  Plan: 1. Recommend the patient start a daily fiber supplement, discussed that he should be getting between 25-35 g of fiber per day. He should also be drinking at least 6-8 8 ounce glasses of water per day. This is especially important since he has decreased his activity level over the past 8 months. Handout was given. 2. If patient has repeat constipation and rectal pressure he should try twice a day over-the-counter Preparation H suppositories. If he continues with rectal pressure/bleeding after trialing this he should call our office. 3. Did discuss with the patient that if he finds himself sitting at home worrying about his symptoms and the possibility of colon cancer he should call our office and we can schedule a colonoscopy. This would need to be scheduled in the [place]. 4. Patient to follow in clinic as needed with Dr. Hilarie Fredrickson or myself  Ellouise Newer, PA-C [facility] 03/06/2017, 3:24 PM  Cc:  Unk Pinto, MD   Addendum: Reviewed and agree with initial management. I understand patient declined rectal exam Given his rectal bleeding I would recommend proceeding to colonoscopy to exclude other pathology even though hemorrhoids remain most likely. Jerene Bears, MD

## 2017-03-08 NOTE — Progress Notes (Signed)
Pt has been scheduled for pre visit and colon with Dr Hilarie Fredrickson.  Pt aware

## 2017-03-30 DIAGNOSIS — Z6829 Body mass index (BMI) 29.0-29.9, adult: Secondary | ICD-10-CM | POA: Diagnosis not present

## 2017-03-30 DIAGNOSIS — B349 Viral infection, unspecified: Secondary | ICD-10-CM | POA: Diagnosis not present

## 2017-03-30 DIAGNOSIS — J22 Unspecified acute lower respiratory infection: Secondary | ICD-10-CM | POA: Diagnosis not present

## 2017-03-30 DIAGNOSIS — R0989 Other specified symptoms and signs involving the circulatory and respiratory systems: Secondary | ICD-10-CM | POA: Diagnosis not present

## 2017-04-10 ENCOUNTER — Encounter: Payer: Self-pay | Admitting: Internal Medicine

## 2017-04-10 ENCOUNTER — Ambulatory Visit (AMBULATORY_SURGERY_CENTER): Payer: Self-pay | Admitting: *Deleted

## 2017-04-10 VITALS — Ht 73.0 in | Wt 221.6 lb

## 2017-04-10 DIAGNOSIS — K625 Hemorrhage of anus and rectum: Secondary | ICD-10-CM

## 2017-04-10 MED ORDER — NA SULFATE-K SULFATE-MG SULF 17.5-3.13-1.6 GM/177ML PO SOLN: ORAL | 0 refills | Status: DC

## 2017-04-10 NOTE — Progress Notes (Signed)
Dates were adjusted due to the fact that the patient rescheduled his colonoscopy. Patient understands the instructions for his prep with good feedback.

## 2017-04-10 NOTE — Progress Notes (Signed)
Pt denies allergies to eggs or soy products. Denies difficulty with sedation or anesthesia. Denies any diet or weight loss medications. Denies use of supplemental oxygen.  Emmi instructions given for procedure.  

## 2017-04-13 ENCOUNTER — Encounter: Payer: Managed Care, Other (non HMO) | Admitting: Internal Medicine

## 2017-04-16 ENCOUNTER — Encounter: Payer: Self-pay | Admitting: Internal Medicine

## 2017-04-16 ENCOUNTER — Ambulatory Visit (AMBULATORY_SURGERY_CENTER): Payer: Managed Care, Other (non HMO) | Admitting: Internal Medicine

## 2017-04-16 VITALS — BP 115/68 | HR 70 | Temp 99.1°F | Resp 16 | Ht 74.0 in | Wt 222.0 lb

## 2017-04-16 DIAGNOSIS — K921 Melena: Secondary | ICD-10-CM | POA: Diagnosis not present

## 2017-04-16 DIAGNOSIS — D124 Benign neoplasm of descending colon: Secondary | ICD-10-CM

## 2017-04-16 DIAGNOSIS — K6289 Other specified diseases of anus and rectum: Secondary | ICD-10-CM

## 2017-04-16 DIAGNOSIS — D123 Benign neoplasm of transverse colon: Secondary | ICD-10-CM | POA: Diagnosis not present

## 2017-04-16 DIAGNOSIS — D125 Benign neoplasm of sigmoid colon: Secondary | ICD-10-CM

## 2017-04-16 DIAGNOSIS — K5900 Constipation, unspecified: Secondary | ICD-10-CM | POA: Diagnosis not present

## 2017-04-16 DIAGNOSIS — K625 Hemorrhage of anus and rectum: Secondary | ICD-10-CM | POA: Diagnosis not present

## 2017-04-16 DIAGNOSIS — Z886 Allergy status to analgesic agent status: Secondary | ICD-10-CM | POA: Diagnosis not present

## 2017-04-16 DIAGNOSIS — J45909 Unspecified asthma, uncomplicated: Secondary | ICD-10-CM | POA: Diagnosis not present

## 2017-04-16 DIAGNOSIS — E669 Obesity, unspecified: Secondary | ICD-10-CM | POA: Diagnosis not present

## 2017-04-16 MED ORDER — SODIUM CHLORIDE 0.9 % IV SOLN: 500.0000 mL | INTRAVENOUS | Status: DC

## 2017-04-16 NOTE — Progress Notes (Signed)
Pt's states no medical or surgical changes since previsit or office visit. 

## 2017-04-16 NOTE — Progress Notes (Signed)
Called to room to assist during endoscopic procedure.  Patient ID and intended procedure confirmed with present staff. Received instructions for my participation in the procedure from the performing physician.  

## 2017-04-16 NOTE — Progress Notes (Signed)
Report to PACU, RN, vss, BBS= Clear.  

## 2017-04-16 NOTE — Op Note (Signed)
[facility] Patient Name: Bradley Roy Procedure Date: 04/16/2017 11:38 AM MRN: [id number] Endoscopist: Jerene Bears , MD Age: 46 Referring MD:  Date of Birth: 1971-07-22 Gender: Male Account #: [id number] Procedure:                Colonoscopy Indications:              Rectal pain, intermittent rectal bleeding,                            constipation Medicines:                Monitored Anesthesia Care Procedure:                Pre-Anesthesia Assessment:                           - Prior to the procedure, a History and Physical                            was performed, and patient medications and                            allergies were reviewed. The patient's tolerance of                            previous anesthesia was also reviewed. The risks                            and benefits of the procedure and the sedation                            options and risks were discussed with the patient.                            All questions were answered, and informed consent                            was obtained. Prior Anticoagulants: The patient has                            taken no previous anticoagulant or antiplatelet                            agents. ASA Grade Assessment: II - A patient with                            mild systemic disease. After reviewing the risks                            and benefits, the patient was deemed in                            satisfactory condition to undergo the procedure.  After obtaining informed consent, the colonoscope                            was passed under direct vision. Throughout the                            procedure, the patient's blood pressure, pulse, and                            oxygen saturations were monitored continuously. The                            Colonoscope was introduced through the anus and                            advanced to the the cecum, identified by                appendiceal orifice and ileocecal valve. The                            colonoscopy was performed without difficulty. The                            patient tolerated the procedure well. The quality                            of the bowel preparation was good. The ileocecal                            valve, appendiceal orifice, and rectum were                            photographed. Scope In: 11:52:19 AM Scope Out: 12:07:32 PM Scope Withdrawal Time: 0 hours 13 minutes 24 seconds  Total Procedure Duration: 0 hours 15 minutes 13 seconds  Findings:                 The digital rectal exam was normal.                           Two sessile polyps were found in the transverse                            colon. The polyps were 3 to 6 mm in size. These                            polyps were removed with a cold snare. Resection                            and retrieval were complete.                           Two sessile polyps were found in the descending  colon. The polyps were 2 to 6 mm in size. These                            polyps were removed with a cold snare. Resection                            and retrieval were complete.                           A 7 mm polyp was found in the sigmoid colon. The                            polyp was sessile. The polyp was removed with a                            cold snare. Resection and retrieval were complete.                           The exam was otherwise without abnormality on                            direct and retroflexion views. Complications:            No immediate complications. Estimated Blood Loss:     Estimated blood loss was minimal. Impression:               - Two 3 to 6 mm polyps in the transverse colon,                            removed with a cold snare. Resected and retrieved.                           - Two 2 to 6 mm polyps in the descending colon,                            removed with a  cold snare. Resected and retrieved.                           - One 7 mm polyp in the sigmoid colon, removed with                            a cold snare. Resected and retrieved.                           - The examination was otherwise normal on direct                            and retroflexion views. Recommendation:           - Patient has a contact number available for                            emergencies. The signs and symptoms of potential  delayed complications were discussed with the                            patient. Return to normal activities tomorrow.                            Written discharge instructions were provided to the                            patient.                           - Resume previous diet.                           - Continue present medications.                           - Await pathology results.                           - Repeat colonoscopy is recommended. The                            colonoscopy date will be determined after pathology                            results from today's exam become available for                            review.                           - Add Benefiber 2 tablespoons daily to help with                            constipation. If ineffective or rectal                            pain/bleeding recurs please contact my office for                            follow-up. Jerene Bears, MD 04/16/2017 12:11:32 PM This report has been signed electronically.

## 2017-04-16 NOTE — Patient Instructions (Signed)
YOU HAD AN ENDOSCOPIC PROCEDURE TODAY AT [facility]:   Refer to the procedure report that was given to you for any specific questions about what was found during the examination.  If the procedure report does not answer your questions, please call your gastroenterologist to clarify.  If you requested that your care partner not be given the details of your procedure findings, then the procedure report has been included in a sealed envelope for you to review at your convenience later.  YOU SHOULD EXPECT: Some feelings of bloating in the abdomen. Passage of more gas than usual.  Walking can help get rid of the air that was put into your GI tract during the procedure and reduce the bloating. If you had a lower endoscopy (such as a colonoscopy or flexible sigmoidoscopy) you may notice spotting of blood in your stool or on the toilet paper. If you underwent a bowel prep for your procedure, you may not have a normal bowel movement for a few days.  Please Note:  You might notice some irritation and congestion in your nose or some drainage.  This is from the oxygen used during your procedure.  There is no need for concern and it should clear up in a day or so.  SYMPTOMS TO REPORT IMMEDIATELY:   Following lower endoscopy (colonoscopy or flexible sigmoidoscopy):  Excessive amounts of blood in the stool  Significant tenderness or worsening of abdominal pains  Swelling of the abdomen that is new, acute  Fever of 100F or higher    For urgent or emergent issues, a gastroenterologist can be reached at any hour by calling [phone].   DIET:  We do recommend a small meal at first, but then you may proceed to your regular diet.  Drink plenty of fluids but you should avoid alcoholic beverages for 24 hours.  ACTIVITY:  You should plan to take it easy for the rest of today and you should NOT DRIVE or use heavy machinery until tomorrow (because of the sedation medicines used during the test).     FOLLOW UP: Our staff will call the number listed on your records the next business day following your procedure to check on you and address any questions or concerns that you may have regarding the information given to you following your procedure. If we do not reach you, we will leave a message.  However, if you are feeling well and you are not experiencing any problems, there is no need to return our call.  We will assume that you have returned to your regular daily activities without incident.  If any biopsies were taken you will be contacted by phone or by letter within the next 1-3 weeks.  Please call us at [phone] if you have not heard about the biopsies in 3 weeks.    SIGNATURES/CONFIDENTIALITY: You and/or your care partner have signed paperwork which will be entered into your electronic medical record.  These signatures attest to the fact that that the information above on your After Visit Summary has been reviewed and is understood.  Full responsibility of the confidentiality of this discharge information lies with you and/or your care-partner.    Add Benefiber 2 tbsp daily for constipation  Information on polyps given to you today.  Await pathology results on polyps removed by Dr Hilarie Fredrickson

## 2017-04-17 ENCOUNTER — Telehealth: Payer: Self-pay | Admitting: *Deleted

## 2017-04-17 ENCOUNTER — Telehealth: Payer: Self-pay

## 2017-04-17 NOTE — Telephone Encounter (Signed)
Answered phone verbalize wrong number, other number not working. Follow up Call-  Call back number 04/16/2017  Post procedure Call Back phone  # [phone]  Permission to leave phone message Yes  Some recent data might be hidden     Patient questions:  Do you have a fever, pain , or abdominal swelling? No. Pain Score  0    Have you tolerated food without any problems? Yes.    Have you been able to return to your normal activities? Yes.    Do you have any questions about your discharge instructions: Diet   No. Medications  No. Follow up visit  No.  Do you have questions or concerns about your Care? No.  Actions: * If pain score is 4 or above: No action needed, pain <4.

## 2017-04-17 NOTE — Telephone Encounter (Signed)
  Follow up Call-  Call back number 04/16/2017  Post procedure Call Back phone  # [phone]  Permission to leave phone message Yes  Some recent data might be hidden     Patient questions:  Do you have a fever, pain , or abdominal swelling? No. Pain Score  0 *  Have you tolerated food without any problems? Yes.    Have you been able to return to your normal activities? Yes.    Do you have any questions about your discharge instructions: Diet   No. Medications  No. Follow up visit  No.  Do you have questions or concerns about your Care? No.  Actions: * If pain score is 4 or above: No action needed, pain <4.

## 2017-04-24 ENCOUNTER — Encounter: Payer: Self-pay | Admitting: Internal Medicine

## 2017-06-05 ENCOUNTER — Ambulatory Visit (INDEPENDENT_AMBULATORY_CARE_PROVIDER_SITE_OTHER): Payer: Managed Care, Other (non HMO) | Admitting: Internal Medicine

## 2017-06-05 ENCOUNTER — Encounter: Payer: Self-pay | Admitting: Internal Medicine

## 2017-06-05 VITALS — BP 124/84 | HR 67 | Temp 97.7°F | Resp 16 | Ht 73.5 in | Wt 221.0 lb

## 2017-06-05 DIAGNOSIS — Z0000 Encounter for general adult medical examination without abnormal findings: Secondary | ICD-10-CM

## 2017-06-05 DIAGNOSIS — Z111 Encounter for screening for respiratory tuberculosis: Secondary | ICD-10-CM

## 2017-06-05 DIAGNOSIS — Z136 Encounter for screening for cardiovascular disorders: Secondary | ICD-10-CM | POA: Diagnosis not present

## 2017-06-05 DIAGNOSIS — R5383 Other fatigue: Secondary | ICD-10-CM

## 2017-06-05 DIAGNOSIS — R7309 Other abnormal glucose: Secondary | ICD-10-CM

## 2017-06-05 DIAGNOSIS — Z79899 Other long term (current) drug therapy: Secondary | ICD-10-CM | POA: Diagnosis not present

## 2017-06-05 DIAGNOSIS — Z125 Encounter for screening for malignant neoplasm of prostate: Secondary | ICD-10-CM

## 2017-06-05 DIAGNOSIS — R030 Elevated blood-pressure reading, without diagnosis of hypertension: Secondary | ICD-10-CM

## 2017-06-05 DIAGNOSIS — E782 Mixed hyperlipidemia: Secondary | ICD-10-CM | POA: Diagnosis not present

## 2017-06-05 DIAGNOSIS — Z0001 Encounter for general adult medical examination with abnormal findings: Secondary | ICD-10-CM

## 2017-06-05 DIAGNOSIS — E559 Vitamin D deficiency, unspecified: Secondary | ICD-10-CM | POA: Diagnosis not present

## 2017-06-05 DIAGNOSIS — Z1212 Encounter for screening for malignant neoplasm of rectum: Secondary | ICD-10-CM

## 2017-06-05 LAB — CBC WITH DIFFERENTIAL/PLATELET
BASOS ABS: 53 {cells}/uL (ref 0–200)
Basophils Relative: 1 %
Eosinophils Absolute: 159 cells/uL (ref 15–500)
Eosinophils Relative: 3 %
HEMATOCRIT: 44 % (ref 38.5–50.0)
HEMOGLOBIN: 15.4 g/dL (ref 13.2–17.1)
LYMPHS ABS: 2014 {cells}/uL (ref 850–3900)
Lymphocytes Relative: 38 %
MCH: 30.9 pg (ref 27.0–33.0)
MCHC: 35 g/dL (ref 32.0–36.0)
MCV: 88.2 fL (ref 80.0–100.0)
MONO ABS: 477 {cells}/uL (ref 200–950)
MPV: 9.2 fL (ref 7.5–12.5)
Monocytes Relative: 9 %
NEUTROS ABS: 2597 {cells}/uL (ref 1500–7800)
NEUTROS PCT: 49 %
Platelets: 258 10*3/uL (ref 140–400)
RBC: 4.99 MIL/uL (ref 4.20–5.80)
RDW: 13.4 % (ref 11.0–15.0)
WBC: 5.3 10*3/uL (ref 3.8–10.8)

## 2017-06-05 LAB — HEPATIC FUNCTION PANEL
ALK PHOS: 48 U/L (ref 40–115)
ALT: 12 U/L (ref 9–46)
AST: 14 U/L (ref 10–40)
Albumin: 4.6 g/dL (ref 3.6–5.1)
BILIRUBIN DIRECT: 0.2 mg/dL (ref <=0.2)
Indirect Bilirubin: 0.8 mg/dL (ref 0.2–1.2)
TOTAL PROTEIN: 6.9 g/dL (ref 6.1–8.1)
Total Bilirubin: 1 mg/dL (ref 0.2–1.2)

## 2017-06-05 LAB — BASIC METABOLIC PANEL WITH GFR
BUN: 12 mg/dL (ref 7–25)
CHLORIDE: 103 mmol/L (ref 98–110)
CO2: 22 mmol/L (ref 20–31)
Calcium: 9.2 mg/dL (ref 8.6–10.3)
Creat: 1.02 mg/dL (ref 0.60–1.35)
GFR, Est African American: >89 mL/min (ref >=60)
GFR, Est Non African American: >89 mL/min (ref >=60)
GLUCOSE: 85 mg/dL (ref 65–99)
POTASSIUM: 4.3 mmol/L (ref 3.5–5.3)
SODIUM: 139 mmol/L (ref 135–146)

## 2017-06-05 LAB — IRON AND TIBC
%SAT: 27 % (ref 15–60)
Iron: 92 ug/dL (ref 50–180)
TIBC: 341 ug/dL (ref 250–425)
UIBC: 249 ug/dL

## 2017-06-05 LAB — LIPID PANEL
CHOL/HDL RATIO: 4.7 ratio (ref <5.0)
CHOLESTEROL: 204 mg/dL — AB (ref <200)
HDL: 43 mg/dL (ref >40)
LDL CALC: 133 mg/dL — AB (ref <100)
Triglycerides: 140 mg/dL (ref <150)
VLDL: 28 mg/dL (ref <30)

## 2017-06-05 LAB — TSH: TSH: 1.31 m[IU]/L (ref 0.40–4.50)

## 2017-06-05 NOTE — Progress Notes (Signed)
[facility]   Unk Pinto, M.D.      Uvaldo Bristle. Silverio Lay, P.A.-C [facility]                [address] [place], [place] [address] Telephone [phone] Telefax [phone] Annual  Screening/Preventative Visit  & Comprehensive Evaluation & Examination     This very nice 46 y.o. MWM presents for a Screening/Preventative Visit & comprehensive evaluation and management of multiple medical co-morbidities.  Patient has been followed expectantly for labile HTN, Prediabetes, Hyperlipidemia and Vitamin D Deficiency. Patient had recent Colonoscopy in June with Dr Hilarie Fredrickson and had # 5 polyps and was recommended f/u Colon in 3 years. (+ FHx Colon Ca in pGF).      Patient has hx/o mild labile HTN predates since  2007 and noted again in 2010 and 2014. Patient's BP has been controlled at home.  Today's BP is at goal - 124/84. Patient denies any cardiac symptoms as chest pain, palpitations, shortness of breath, dizziness or ankle swelling.     Patient' has hx/o elevated LDL 116 in 2016 but last lipids were  controlled with diet. Lab Results  Component Value Date   CHOL 174 05/04/2016   HDL 49 05/04/2016   LDLCALC 100 05/04/2016   TRIG 125 05/04/2016   CHOLHDL 3.6 05/04/2016      Patient is monitored proactively for prediabetes and patient denies reactive hypoglycemic symptoms, visual blurring, diabetic polys or paresthesias. Last A1c was at goal: Lab Results  Component Value Date   HGBA1C 4.7 05/04/2016       Finally, patient has history of Vitamin D Deficiency ("33" in 2008) and last vitamin D was still low: Lab Results  Component Value Date   VD25OH 41 05/04/2016   Current Outpatient Prescriptions on File Prior to Visit  Medication Sig  . albuterol (PROVENTIL HFA;VENTOLIN HFA) 108 (90 BASE) MCG/ACT inhaler Inhale 1-2 puffs into the lungs every 6 (six) hours as needed for wheezing or shortness of breath  (cough).  . Albuterol Sulfate (PROAIR RESPICLICK) 048 (90 BASE) MCG/ACT AEPB Inhale 2 puffs into the lungs every 6 (six) hours as needed.  Marland Kitchen azelastine (ASTELIN) 0.1 % nasal spray USE 1-2 SPRAYS IN EACH NOSTRIL TWICE A DAY  . fexofenadine (ALLEGRA) 30 MG tablet Take 30 mg by mouth 2 (two) times daily.  . fluticasone (FLONASE) 50 MCG/ACT nasal spray Place 1 spray into both nostrils 2 (two) times daily.  Marland Kitchen HYDROcodone-acetaminophen (NORCO) 5-325 MG per tablet Take 1 tablet by mouth every 6 (six) hours as needed for moderate pain.   No current facility-administered medications on file prior to visit.    Allergies  Allergen Reactions  . Codeine     Headache   Past Medical History:  Diagnosis Date  . Allergy    seasonal allergies  . Asthma   . GERD (gastroesophageal reflux disease)    Health Maintenance  Topic Date Due  . INFLUENZA VACCINE  06/13/2017  . COLONOSCOPY  04/16/2020  . TETANUS/TDAP  06/04/2026  . HIV Screening  Completed   Immunization History  Administered Date(s) Administered  . PPD Test 05/04/2014, 05/05/2015, 05/04/2016  . Tdap 05/04/2014, 06/04/2016   Past Surgical History:  Procedure Laterality Date  . FINGER SURGERY Right    middle finger  . KNEE ARTHROSCOPY Right 2011  . WRIST SURGERY Left    Family History  Problem Relation Age of Onset  . Colon cancer Paternal Grandfather   . Heart disease Maternal Grandfather    Social History   Social History  . Marital status: Married    Spouse name: N/A  . Number of children: N/A  . Years of education: N/A   Occupational History  . Not on file.   Social History Main Topics  . Smoking status: Former Smoker    Quit date: 11/23/1992  . Smokeless tobacco: Current User    Types: Snuff  . Alcohol use 0.0 oz/week     Comment: very rarely  . Drug use: No  . Sexual activity: Not on file   Other Topics Concern  . Not on file   Social History Narrative  . No narrative on file    ROS Constitutional:  Denies fever, chills, weight loss/gain, headaches, insomnia,  night sweats or change in appetite. Does c/o fatigue. Eyes: Denies redness, blurred vision, diplopia, discharge, itchy or watery eyes.  ENT: Denies discharge, congestion, post nasal drip, epistaxis, sore throat, earache, hearing loss, dental pain, Tinnitus, Vertigo, Sinus pain or snoring.  Cardio: Denies chest pain, palpitations, irregular heartbeat, syncope, dyspnea, diaphoresis, orthopnea, PND, claudication or edema Respiratory: denies cough, dyspnea, DOE, pleurisy, hoarseness, laryngitis or wheezing.  Gastrointestinal: Denies dysphagia, heartburn, reflux, water brash, pain, cramps, nausea, vomiting, bloating, diarrhea, constipation, hematemesis, melena, hematochezia, jaundice or hemorrhoids Genitourinary: Denies dysuria, frequency, urgency, nocturia, hesitancy, discharge, hematuria or flank pain Musculoskeletal: Denies arthralgia, myalgia, stiffness, Jt. Swelling, pain, limp or strain/sprain. Denies Falls. Skin: Denies puritis, rash, hives, warts, acne, eczema or change in skin lesion Neuro: No weakness, tremor, incoordination, spasms, paresthesia or pain Psychiatric: Denies confusion, memory loss or sensory loss. Denies Depression. Endocrine: Denies change in weight, skin, hair change, nocturia, and paresthesia, diabetic polys, visual blurring or hyper / hypo glycemic episodes.  Heme/Lymph: No excessive bleeding, bruising or enlarged lymph nodes.  Physical Exam  BP 124/84   Pulse 67   Temp 97.7 F (36.5 C)   Resp 16   Ht 6' 1.5" (1.867 m)   Wt 221 lb (100.2 kg)   BMI 28.76 kg/m   General Appearance: Well nourished and well groomed and in no apparent distress.  Eyes: PERRLA, EOMs, conjunctiva no swelling or erythema, normal fundi and vessels. Sinuses: No frontal/maxillary tenderness ENT/Mouth: EACs patent / TMs  nl. Nares clear without erythema, swelling, mucoid exudates. Oral hygiene is good. No erythema, swelling, or  exudate. Tongue normal, non-obstructing. Tonsils not swollen or erythematous. Hearing normal.  Neck: Supple, thyroid normal. No bruits, nodes or JVD. Respiratory: Respiratory effort normal.  BS equal and clear bilateral without rales, rhonci, wheezing or stridor. Cardio: Heart sounds are normal with regular rate and rhythm and no murmurs, rubs or gallops. Peripheral pulses are normal and equal bilaterally without edema. No aortic or femoral bruits. Chest: symmetric with normal excursions and percussion.  Abdomen: Soft, with Nl bowel sounds. Nontender, no guarding, rebound, hernias, masses, or organomegaly.  Lymphatics: Non tender without lymphadenopathy.  Genitourinary: No hernias.Testes nl. DRE - deferred for having recent Colonoscopy.  Musculoskeletal: Full ROM all peripheral extremities, joint stability, 5/5 strength, and normal gait. Skin: Warm and dry without rashes, lesions, cyanosis, clubbing or  ecchymosis.  Neuro: Cranial nerves intact, reflexes equal bilaterally. Normal muscle tone, no cerebellar symptoms. Sensation intact.  Pysch: Alert and oriented X 3 with normal affect, insight and judgment appropriate.   Assessment and Plan  1. Annual Preventative/Screening Exam   2. Elevated BP without diagnosis of  hypertension  - EKG 12-Lead - Urinalysis, Routine w reflex microscopic - Microalbumin / creatinine urine ratio - CBC with Differential/Platelet - BASIC METABOLIC PANEL WITH GFR - Magnesium - TSH  3. Hyperlipidemia, mixed  - EKG 12-Lead - Hepatic function panel - Lipid panel  4. Other abnormal glucose  - Hemoglobin A1c - Insulin, random  5. Vitamin D deficiency  - VITAMIN D 25 Hydroxy  6. Screening for rectal cancer  - POC Hemoccult Bld/Stl   7. Prostate cancer screening  - PSA  8. Screening for ischemic heart disease  - EKG 12-Lead  9. Screening examination for pulmonary tuberculosis   10. Fatigue  - Vitamin B12 - Iron and TIBC - Testosterone -  CBC with Differential/Platelet - TSH  11. Medication management  - Urinalysis, Routine w reflex microscopic - Microalbumin / creatinine urine ratio - CBC with Differential/Platelet - BASIC METABOLIC PANEL WITH GFR - Hepatic function panel - Magnesium - Lipid panel - TSH - Hemoglobin A1c - Insulin, random - VITAMIN D 25 Hydroxy       Patient was counseled in prudent diet, weight control to achieve/maintain BMI less than 25, BP monitoring, regular exercise and medications as discussed.  Discussed med effects and SE's. Routine screening labs and tests as requested with regular follow-up as recommended. Over 40 minutes of exam, counseling, chart review and high complex critical decision making was performed

## 2017-06-05 NOTE — Patient Instructions (Signed)

## 2017-06-06 LAB — URINALYSIS, ROUTINE W REFLEX MICROSCOPIC
Bilirubin Urine: NEGATIVE
Glucose, UA: NEGATIVE
Hgb urine dipstick: NEGATIVE
Ketones, ur: NEGATIVE
LEUKOCYTES UA: NEGATIVE
NITRITE: NEGATIVE
Protein, ur: NEGATIVE
Specific Gravity, Urine: 1.012 (ref 1.001–1.035)
pH: 5.5 (ref 5.0–8.0)

## 2017-06-06 LAB — MICROALBUMIN / CREATININE URINE RATIO
Creatinine, Urine: 81 mg/dL (ref 20–370)
Microalb Creat Ratio: 5 mcg/mg creat (ref <30)
Microalb, Ur: 0.4 mg/dL

## 2017-06-06 LAB — TESTOSTERONE: TESTOSTERONE: 759 ng/dL (ref 250–827)

## 2017-06-06 LAB — PSA: PSA: 0.5 ng/mL (ref <=4.0)

## 2017-06-06 LAB — VITAMIN B12: VITAMIN B 12: 486 pg/mL (ref 200–1100)

## 2017-06-06 LAB — MAGNESIUM: Magnesium: 2 mg/dL (ref 1.5–2.5)

## 2017-06-06 LAB — HEMOGLOBIN A1C
Hgb A1c MFr Bld: 4.5 % (ref <5.7)
Mean Plasma Glucose: 82 mg/dL

## 2017-06-06 LAB — INSULIN, RANDOM: Insulin: 3.6 u[IU]/mL (ref 2.0–19.6)

## 2017-06-06 LAB — VITAMIN D 25 HYDROXY (VIT D DEFICIENCY, FRACTURES): Vit D, 25-Hydroxy: 35 ng/mL (ref 30–100)

## 2017-12-13 ENCOUNTER — Encounter: Payer: Self-pay | Admitting: Adult Health

## 2017-12-13 ENCOUNTER — Ambulatory Visit: Payer: 59 | Admitting: Adult Health

## 2017-12-13 VITALS — BP 126/88 | HR 61 | Temp 97.3°F | Ht 73.5 in | Wt 217.0 lb

## 2017-12-13 DIAGNOSIS — R058 Other specified cough: Secondary | ICD-10-CM

## 2017-12-13 DIAGNOSIS — R05 Cough: Secondary | ICD-10-CM | POA: Diagnosis not present

## 2017-12-13 MED ORDER — ALBUTEROL SULFATE HFA 108 (90 BASE) MCG/ACT IN AERS
1.0000 | INHALATION_SPRAY | Freq: Four times a day (QID) | RESPIRATORY_TRACT | 2 refills | Status: DC | PRN
Start: 2017-12-13 — End: 2018-05-27

## 2017-12-13 MED ORDER — BENZONATATE 100 MG PO CAPS: 200.0000 mg | ORAL_CAPSULE | Freq: Three times a day (TID) | ORAL | 0 refills | Status: DC | PRN

## 2017-12-13 MED ORDER — PROMETHAZINE-DM 6.25-15 MG/5ML PO SYRP: 5.0000 mL | ORAL_SOLUTION | Freq: Four times a day (QID) | ORAL | 1 refills | Status: DC | PRN

## 2017-12-13 NOTE — Patient Instructions (Addendum)
Cough, Adult Coughing is a reflex that clears your throat and your airways. Coughing helps to heal and protect your lungs. It is normal to cough occasionally, but a cough that happens with other symptoms or lasts a long time may be a sign of a condition that needs treatment. A cough may last only 2-3 weeks (acute), or it may last longer than 8 weeks (chronic). What are the causes? Coughing is commonly caused by:  Breathing in substances that irritate your lungs.  A viral or bacterial respiratory infection.  Allergies.  Asthma.  Postnasal drip.  Smoking.  Acid backing up from the stomach into the esophagus (gastroesophageal reflux).  Certain medicines.  Chronic lung problems, including COPD (or rarely, lung cancer).  Other medical conditions such as heart failure.  Follow these instructions at home: Pay attention to any changes in your symptoms. Take these actions to help with your discomfort:  Take medicines only as told by your health care provider. ? If you were prescribed an antibiotic medicine, take it as told by your health care provider. Do not stop taking the antibiotic even if you start to feel better. ? Talk with your health care provider before you take a cough suppressant medicine.  Drink enough fluid to keep your urine clear or pale yellow.  If the air is dry, use a cold steam vaporizer or humidifier in your bedroom or your home to help loosen secretions.  Avoid anything that causes you to cough at work or at home.  If your cough is worse at night, try sleeping in a semi-upright position.  Avoid cigarette smoke. If you smoke, quit smoking. If you need help quitting, ask your health care provider.  Avoid caffeine.  Avoid alcohol.  Rest as needed.  Contact a health care provider if:  You have new symptoms.  You cough up pus.  Your cough does not get better after 2-3 weeks, or your cough gets worse.  You cannot control your cough with suppressant  medicines and you are losing sleep.  You develop pain that is getting worse or pain that is not controlled with pain medicines.  You have a fever.  You have unexplained weight loss.  You have night sweats. Get help right away if:  You cough up blood.  You have difficulty breathing.  Your heartbeat is very fast. This information is not intended to replace advice given to you by your health care provider. Make sure you discuss any questions you have with your health care provider. Document Released: 04/28/2011 Document Revised: 04/06/2016 Document Reviewed: 01/06/2015 Elsevier Interactive Patient Education  2018 [employer].     Common causes of cough OR hoarseness OR sore throat:   Allergies, Viral Infections, Acid Reflux and Bacterial Infections.   Allergies and viral infections cause a cough OR sore throat by post nasal drip and are often worse at night, can also have sneezing, lower grade fevers, clear/yellow mucus. This is best treated with allergy medications or nasal sprays.  Please get on allegra for 1-2 weeks The strongest is allegra or fexafinadine  Cheapest at [employer], [employer], [employer]   Bacterial infections are more severe than allergies or viral infections with fever, teeth pain, fatigue. This can be treated with prednisone and the same over the counter medication and after 7 days can be treated with an antibiotic.   Silent reflux/GERD can cause a cough OR sore throat OR hoarseness WITHOUT heart burn because the esophagus that goes to the stomach and trachea that goes  to the lungs are very close and when you lay down the acid can irritate your throat and lungs. This can cause hoarseness, cough, and wheezing. Please stop any alcohol or anti-inflammatories like aleve/advil/ibuprofen and start an over the counter Prilosec or omeprazole 1-2 times daily 51mins before food for 2 weeks, then switch to over the counter zantac/ratinidine or pepcid/famotadine once at night for 2  weeks.    sometimes irritation causes more irritation. Try voice rest, use sugar free cough drops to prevent coughing, and try to stop clearing your throat.   If you ever have a cough that does not go away after trying these things please make a follow up visit for further evaluation or we can refer you to a specialist. Or if you ever have shortness of breath or chest pain go to the ER.

## 2017-12-13 NOTE — Progress Notes (Addendum)
Assessment and Plan:  Bradley Roy was seen today for uri.  Diagnoses and all orders for this visit:  Post-viral cough syndrome Benign exam- Discussed the importance of avoiding unnecessary antibiotic therapy. Post-viral dry cough my last several weeks Advised voice rest Suggested symptomatic OTC remedies. Nasal saline spray for congestion. Nasal steroids, allergy pill Follow up as needed - monitor for productivity of cough, fever, or other new/concerning symptoms.  -     promethazine-dextromethorphan (PROMETHAZINE-DM) 6.25-15 MG/5ML syrup; Take 5 mLs by mouth 4 (four) times daily as needed for cough. -     benzonatate (TESSALON PERLES) 100 MG capsule; Take 2 capsules (200 mg total) by mouth 3 (three) times daily as needed for cough (Max: 600mg  per day).   Refill as last was over a year ago:  -     albuterol (PROVENTIL HFA;VENTOLIN HFA) 108 (90 Base) MCG/ACT inhaler; Inhale 1-2 puffs into the lungs every 6 (six) hours as needed for wheezing or shortness of breath (cough).    Further disposition pending results of labs. Discussed med's effects and SE's.   Over 15 minutes of exam, counseling, chart review, and critical decision making was performed.   Future Appointments  Date Time Provider [place]  06/27/2018  2:00 PM Unk Pinto, MD [facility] None    ------------------------------------------------------------------------------------------------------------------   HPI BP 126/88   Pulse 61   Temp (!) 97.3 F (36.3 C)   Ht 6' 1.5" (1.867 m)   Wt 217 lb (98.4 kg)   SpO2 97%   BMI 28.24 kg/m   47 y.o.male presents for a "head cold" ongoing for 3 weeks which progressed to chest congestion. He had an old prescription for zpak and prednisone taper which he completed, was feeling much better for 2 days after completion then symptoms seem to be returning and now are worse. Difficulty sleeping at night due to ongoing alternately dry and mildly productive cough.   He  has had 2 headaches in the period; he reports he has never had these before - he reports evening headaches of bilateral temples after he has eaten. Headaches have eased on, 5/10 sharp pain with photosensitivity. Denies nausea, vomiting, dizziness, numbness, tingling, weakness.   He has also been taking tylenol cold medication and mucinex.  He has hx of asthma as a child without significant recent issues or flares.   Past Medical History:  Diagnosis Date  . Allergy    seasonal allergies  . Asthma   . GERD (gastroesophageal reflux disease)      Allergies  Allergen Reactions  . Codeine     Headache    Current Outpatient Medications on File Prior to Visit  Medication Sig  . Albuterol Sulfate (PROAIR RESPICLICK) 790 (90 BASE) MCG/ACT AEPB Inhale 2 puffs into the lungs every 6 (six) hours as needed.  Marland Kitchen azelastine (ASTELIN) 0.1 % nasal spray USE 1-2 SPRAYS IN EACH NOSTRIL TWICE A DAY  . fexofenadine (ALLEGRA) 30 MG tablet Take 30 mg by mouth 2 (two) times daily.  . fluticasone (FLONASE) 50 MCG/ACT nasal spray Place 1 spray into both nostrils 2 (two) times daily.  Marland Kitchen albuterol (PROVENTIL HFA;VENTOLIN HFA) 108 (90 BASE) MCG/ACT inhaler Inhale 1-2 puffs into the lungs every 6 (six) hours as needed for wheezing or shortness of breath (cough). (Patient not taking: Reported on 12/13/2017)  . HYDROcodone-acetaminophen (NORCO) 5-325 MG per tablet Take 1 tablet by mouth every 6 (six) hours as needed for moderate pain. (Patient not taking: Reported on 12/13/2017)   No current facility-administered  medications on file prior to visit.     ROS: Review of Systems  Constitutional: Negative for chills, diaphoresis, fever and malaise/fatigue.  HENT: Positive for congestion. Negative for ear discharge, ear pain, hearing loss, sinus pain, sore throat and tinnitus.   Eyes: Negative for blurred vision, pain, discharge and redness.  Respiratory: Positive for cough. Negative for hemoptysis, sputum production,  shortness of breath, wheezing and stridor.   Cardiovascular: Negative for chest pain, palpitations and orthopnea.  Gastrointestinal: Negative for abdominal pain, diarrhea, nausea and vomiting.  Genitourinary: Negative.   Musculoskeletal: Negative for joint pain and myalgias.  Skin: Negative for rash.  Neurological: Positive for headaches (x2, self limited). Negative for dizziness, sensory change and weakness.  Endo/Heme/Allergies: Negative for environmental allergies.  Psychiatric/Behavioral: Negative.   All other systems reviewed and are negative.    Physical Exam:  BP 126/88   Pulse 61   Temp (!) 97.3 F (36.3 C)   Ht 6' 1.5" (1.867 m)   Wt 217 lb (98.4 kg)   SpO2 97%   BMI 28.24 kg/m   General Appearance: Well nourished, in no apparent distress. Eyes: PERRLA, EOMs, conjunctiva no swelling or erythema Sinuses: No Frontal/maxillary tenderness ENT/Mouth: Ext aud canals clear, TMs without erythema, bulging. Pharynx mildly pink but not significantly erythematous, no swelling, or exudate on post pharynx.  Tonsils not swollen or erythematous. Hearing normal.  Neck: Supple.  Respiratory: Respiratory effort normal, BS equal bilaterally without rales, rhonchi, wheezing or stridor.  Cardio: RRR with no MRGs. Brisk peripheral pulses without edema.  Abdomen: Soft, + BS.  Non tender.  Lymphatics: Non tender without lymphadenopathy.  Musculoskeletal: Full ROM, 5/5 strength, normal gait.  Skin: Warm, dry without rashes, lesions, ecchymosis.  Neuro: Cranial nerves intact. Normal muscle tone, no cerebellar symptoms. Sensation intact.  Psych: Awake and oriented X 3, normal affect, Insight and Judgment appropriate.    Izora Ribas, NP 11:06 AM [facility]

## 2018-01-21 ENCOUNTER — Ambulatory Visit: Payer: 59 | Admitting: Physician Assistant

## 2018-01-21 ENCOUNTER — Encounter: Payer: Self-pay | Admitting: Physician Assistant

## 2018-01-21 VITALS — BP 124/86 | HR 83 | Temp 97.6°F | Resp 16 | Ht 73.5 in | Wt 225.0 lb

## 2018-01-21 DIAGNOSIS — J0100 Acute maxillary sinusitis, unspecified: Secondary | ICD-10-CM

## 2018-01-21 MED ORDER — PREDNISONE 20 MG PO TABS: ORAL_TABLET | ORAL | 0 refills | Status: DC

## 2018-01-21 MED ORDER — AZITHROMYCIN 250 MG PO TABS: ORAL_TABLET | ORAL | 1 refills | Status: AC

## 2018-01-21 NOTE — Patient Instructions (Signed)

## 2018-01-21 NOTE — Progress Notes (Signed)
47 y.o. male with history of asthma presents with 12 days of URI symptoms, but did have viral cough/cold did not take ABX 01/31. He is on Flonase, Allegra, Astelin, cough syrup.  Symptoms include sinus congestion, cough at night with green mucus, left ear pain, teeth pain, no fever, chills, SOB, CP.   Blood pressure 124/86, pulse 83, temperature 97.6 F (36.4 C), resp. rate 16, height 6' 1.5" (1.867 m), weight 225 lb (102.1 kg), SpO2 97 %.  Problem list has Asthma; Vitamin D deficiency; Medication management; Elevated BP; Other abnormal glucose; and Hyperlipidemia on their problem list.  Medications Current Outpatient Medications on File Prior to Visit  Medication Sig  . albuterol (PROVENTIL HFA;VENTOLIN HFA) 108 (90 Base) MCG/ACT inhaler Inhale 1-2 puffs into the lungs every 6 (six) hours as needed for wheezing or shortness of breath (cough).  . Albuterol Sulfate (PROAIR RESPICLICK) 633 (90 BASE) MCG/ACT AEPB Inhale 2 puffs into the lungs every 6 (six) hours as needed.  Marland Kitchen azelastine (ASTELIN) 0.1 % nasal spray USE 1-2 SPRAYS IN EACH NOSTRIL TWICE A DAY  . fexofenadine (ALLEGRA) 30 MG tablet Take 30 mg by mouth 2 (two) times daily.  . fluticasone (FLONASE) 50 MCG/ACT nasal spray Place 1 spray into both nostrils 2 (two) times daily.   No current facility-administered medications on file prior to visit.     Allergies: Allergies  Allergen Reactions  . Codeine     Headache    ROS: See HPI  Physical Exam Physical Exam  Constitutional: He is oriented to person, place, and time. He appears well-developed and well-nourished.  HENT:  Head: Normocephalic and atraumatic.  Right Ear: Hearing and tympanic membrane normal.  Left Ear: Hearing normal. No tenderness. No mastoid tenderness. Tympanic membrane is injected, erythematous and bulging. Tympanic membrane is not scarred, not perforated and not retracted. A middle ear effusion is present.  Nose: Right sinus exhibits maxillary sinus  tenderness. Left sinus exhibits maxillary sinus tenderness.  Mouth/Throat: Uvula is midline and mucous membranes are normal. Posterior oropharyngeal erythema present. No oropharyngeal exudate, posterior oropharyngeal edema or tonsillar abscesses.  Eyes: Conjunctivae are normal. Pupils are equal, round, and reactive to light.  Neck: Normal range of motion. Neck supple.  Cardiovascular: Normal rate and regular rhythm.  Pulmonary/Chest: Effort normal and breath sounds normal.  Abdominal: Soft. Bowel sounds are normal.  Musculoskeletal: Normal range of motion.  Lymphadenopathy:    He has no cervical adenopathy.  Neurological: He is alert and oriented to person, place, and time.  Skin: Skin is warm and dry. No rash noted.    Assessment and Plan:  Acute non-recurrent maxillary sinusitis -     azithromycin (ZITHROMAX) 250 MG tablet; Take 2 tablets (500 mg) on  Day 1,  followed by 1 tablet (250 mg) once daily on Days 2 through 5. -     predniSONE (DELTASONE) 20 MG tablet; 2 tablets daily for 3 days, 1 tablet daily for 4 days.  If you are not getting better call the office for an office visit. If you are getting worse go to the ER.    Future Appointments  Date Time Provider [place]  06/27/2018  2:00 PM Unk Pinto, MD [facility] None

## 2018-03-26 ENCOUNTER — Other Ambulatory Visit: Payer: Self-pay | Admitting: *Deleted

## 2018-03-26 MED ORDER — AZELASTINE HCL 0.1 % NA SOLN: NASAL | 6 refills | Status: DC

## 2018-05-27 ENCOUNTER — Ambulatory Visit: Payer: 59 | Admitting: Internal Medicine

## 2018-05-27 ENCOUNTER — Encounter: Payer: Self-pay | Admitting: Internal Medicine

## 2018-05-27 VITALS — BP 110/80 | HR 64 | Temp 97.2°F | Resp 18 | Ht 74.0 in | Wt 221.6 lb

## 2018-05-27 DIAGNOSIS — J042 Acute laryngotracheitis: Secondary | ICD-10-CM

## 2018-05-27 MED ORDER — PREDNISONE 20 MG PO TABS: ORAL_TABLET | ORAL | 0 refills | Status: DC

## 2018-05-27 MED ORDER — AZITHROMYCIN 250 MG PO TABS: ORAL_TABLET | ORAL | 1 refills | Status: DC

## 2018-05-27 NOTE — Progress Notes (Signed)
  Subjective:    Patient ID: Bradley Roy, male    DOB: 1971/04/05, 47 y.o.   MRN: [id number]  HPI    This nice 47 yo MWM presents with a 7-10 day hx/o laryngitis, hoarseness and persistent cough Denies fever, chills, sweats. No Dyspnea.   Medication Sig  . PROAIT AEPB Inhale 2 puffs into the lungs every 6 (six) hours as needed.  Marland Kitchen azelastine (ASTELIN) 0.1 % nasal spray USE 1-2 SPRAYS IN EACH NOSTRIL TWICE A DAY  . fexofenadine  30 MG tablet Take 30 mg by mouth 2 (two) times daily.  Marland Kitchen FLONASE nasal spray Place 1 spray into both nostrils 2 (two) times daily.  Marland Kitchen albuterol  HFA  inhaler Inhale 1-2 puffs  every 6 (six) hours as needed   Allergies  Allergen Reactions  . Codeine     Headache   Past Medical History:  Diagnosis Date  . Allergy    seasonal allergies  . Asthma   . GERD (gastroesophageal reflux disease)    Review of Systems  10 point systems review negative except as above.    Objective:   Physical Exam  BP 110/80   Pulse 64   Temp (!) 97.2 F (36.2 C)   Resp 18   Ht 6\' 2"  (1.88 m)   Wt 221 lb 9.6 oz (100.5 kg)   BMI 28.45 kg/m   Hoarse raspy laryngitic voice. No Stridor.  HEENT - WNL. Neck - supple.  Chest - Few scattered rales bilat. Few  Forced end exp wheezes. Cor - Nl HS. RRR w/o sig MGR. PP 1(+). No edema. MS- FROM w/o deformities.  Gait Nl. Neuro -  Nl w/o focal abnormalities.    Assessment & Plan:   1. Laryngotracheitis  - predniSONE (DELTASONE) 20 MG tablet; 1 tab 3 x day for 3 days, then 1 tab 2 x day for 3 days, then 1 tab 1 x day for 5 days  Dispense: 20 tablet  - azithromycin (ZITHROMAX) 250 MG tablet; Take 2 tablets (500 mg) on  Day 1,  followed by 1 tablet (250 mg) once daily on Days 2 through 5.  Dispense: 6 each; Refill: 1  - continue Mucinex and Nyquil at hs.   - discussed meds & SE's & ROV - prn.  - Sx's Trelegy  X 14 dfays

## 2018-05-27 NOTE — Patient Instructions (Signed)
Laryngitis Laryngitis is inflammation of your vocal cords. This causes hoarseness, coughing, loss of voice, sore throat, or a dry throat. Your vocal cords are two bands of muscles that are found in your throat. When you speak, these cords come together and vibrate. These vibrations come out through your mouth as sound. When your vocal cords are inflamed, your voice sounds different. Laryngitis can be temporary (acute) or long-term (chronic). Most cases of acute laryngitis improve with time. Chronic laryngitis is laryngitis that lasts for more than three weeks. What are the causes? Acute laryngitis may be caused by:  A viral infection.  Lots of talking, yelling, or singing. This is also called vocal strain.  Bacterial infections.  Chronic laryngitis may be caused by:  Vocal strain.  Injury to your vocal cords.  Acid reflux (gastroesophageal reflux disease or GERD).  Allergies.  Sinus infection.  Smoking.  Alcohol abuse.  Breathing in chemicals or dust.  Growths on the vocal cords.  What increases the risk? Risk factors for laryngitis include:  Smoking.  Alcohol abuse.  Having allergies.  What are the signs or symptoms? Symptoms of laryngitis may include:  Low, hoarse voice.  Loss of voice.  Dry cough.  Sore throat.  Stuffy nose.  How is this diagnosed? Laryngitis may be diagnosed by:  Physical exam.  Throat culture.  Blood test.  Laryngoscopy. This procedure allows your health care provider to look at your vocal cords with a mirror or viewing tube.  How is this treated? Treatment for laryngitis depends on what is causing it. Usually, treatment involves resting your voice and using medicines to soothe your throat. However, if your laryngitis is caused by a bacterial infection, you may need to take antibiotic medicine. If your laryngitis is caused by a growth, you may need to have a procedure to remove it. Follow these instructions at home:  Drink  enough fluid to keep your urine clear or pale yellow.  Breathe in moist air. Use a humidifier if you live in a dry climate.  Take medicines only as directed by your health care provider.  If you were prescribed an antibiotic medicine, finish it all even if you start to feel better.  Do not smoke cigarettes or electronic cigarettes. If you need help quitting, ask your health care provider.  Talk as little as possible. Also avoid whispering, which can cause vocal strain.  Write instead of talking. Do this until your voice is back to normal. Contact a health care provider if:  You have a fever.  You have increasing pain.  You have difficulty swallowing. Get help right away if:  You cough up blood.  You have trouble breathing. This information is not intended to replace advice given to you by your health care provider. Make sure you discuss any questions you have with your health care provider. Document Released: 10/30/2005 Document Revised: 04/06/2016 Document Reviewed: 04/14/2014 Elsevier Interactive Patient Education  2018 [employer] Inc[employer]  

## 2018-06-27 ENCOUNTER — Encounter: Payer: Self-pay | Admitting: Internal Medicine

## 2018-06-27 ENCOUNTER — Ambulatory Visit (INDEPENDENT_AMBULATORY_CARE_PROVIDER_SITE_OTHER): Payer: 59 | Admitting: Internal Medicine

## 2018-06-27 VITALS — BP 124/78 | HR 72 | Temp 97.3°F | Resp 16 | Ht 73.5 in | Wt 214.4 lb

## 2018-06-27 DIAGNOSIS — Z111 Encounter for screening for respiratory tuberculosis: Secondary | ICD-10-CM | POA: Diagnosis not present

## 2018-06-27 DIAGNOSIS — Z0000 Encounter for general adult medical examination without abnormal findings: Secondary | ICD-10-CM | POA: Diagnosis not present

## 2018-06-27 DIAGNOSIS — E782 Mixed hyperlipidemia: Secondary | ICD-10-CM | POA: Diagnosis not present

## 2018-06-27 DIAGNOSIS — Z87891 Personal history of nicotine dependence: Secondary | ICD-10-CM

## 2018-06-27 DIAGNOSIS — Z8249 Family history of ischemic heart disease and other diseases of the circulatory system: Secondary | ICD-10-CM

## 2018-06-27 DIAGNOSIS — Z1212 Encounter for screening for malignant neoplasm of rectum: Secondary | ICD-10-CM | POA: Diagnosis not present

## 2018-06-27 DIAGNOSIS — Z79899 Other long term (current) drug therapy: Secondary | ICD-10-CM

## 2018-06-27 DIAGNOSIS — Z125 Encounter for screening for malignant neoplasm of prostate: Secondary | ICD-10-CM

## 2018-06-27 DIAGNOSIS — R030 Elevated blood-pressure reading, without diagnosis of hypertension: Secondary | ICD-10-CM | POA: Diagnosis not present

## 2018-06-27 DIAGNOSIS — E559 Vitamin D deficiency, unspecified: Secondary | ICD-10-CM

## 2018-06-27 DIAGNOSIS — Z136 Encounter for screening for cardiovascular disorders: Secondary | ICD-10-CM

## 2018-06-27 DIAGNOSIS — R5383 Other fatigue: Secondary | ICD-10-CM

## 2018-06-27 DIAGNOSIS — R7309 Other abnormal glucose: Secondary | ICD-10-CM | POA: Diagnosis not present

## 2018-06-27 DIAGNOSIS — Z1211 Encounter for screening for malignant neoplasm of colon: Secondary | ICD-10-CM | POA: Diagnosis not present

## 2018-06-27 DIAGNOSIS — Z0001 Encounter for general adult medical examination with abnormal findings: Secondary | ICD-10-CM

## 2018-06-27 NOTE — Patient Instructions (Signed)

## 2018-06-27 NOTE — Progress Notes (Signed)
[facility]   Unk Pinto, M.D.     Bradley Roy, P.A.-C Liane Comber, [place]                [address] [place] [address] Telephone [phone] Telefax [phone] Annual  Screening/Preventative Visit  & Comprehensive Evaluation & Examination     This very nice 47 y.o. MWM presents for a Screening /Preventative Visit & comprehensive evaluation and management of multiple medical co-morbidities.  Patient has been followed for labile HTN, HLD, T2_NIDDM  Prediabetes and Vitamin D Deficiency. In June 2018, patient had Colonoscopy with Dr Hilarie Fredrickson finding  # 5 polyps and was recommended 3 years f/u Colon (+ FHx Colon Ca in pGF).     Patient has been followed since 2007 for mild labile  HTN. Patient's BP has been controlled at home.  Today's BP is at goal - 124/78. Patient denies any cardiac symptoms as chest pain, palpitations, shortness of breath, dizziness or ankle swelling.     Patient's hyperlipidemia has intermittently been controlled with diet and . Patient denies myalgias or other medication SE's. Last lipids were not at goal: Lab Results  Component Value Date   CHOL 204 (H) 06/05/2017   HDL 43 06/05/2017   LDLCALC 133 (H) 06/05/2017   TRIG 140 06/05/2017   CHOLHDL 4.7 06/05/2017      Patient is followed expectantly for prediabetes and patient denies reactive hypoglycemic symptoms, visual blurring, diabetic polys or paresthesias. Last A1c was Normal & at goal: Lab Results  Component Value Date   HGBA1C 4.5 06/05/2017       Finally, patient has history of Vitamin D Deficiency ("33" in 2008) and last vitamin D was still low: Lab Results  Component Value Date   VD25OH 35 06/05/2017   Current Outpatient Medications on File Prior to Visit  Medication Sig  . Albuterol Sulfate (PROAIR RESPICLICK) 355 (90 BASE) MCG/ACT AEPB Inhale 2 puffs into the lungs every 6 (six) hours  as needed.  Marland Kitchen azelastine (ASTELIN) 0.1 % nasal spray USE 1-2 SPRAYS IN EACH NOSTRIL TWICE A DAY  . fexofenadine (ALLEGRA) 30 MG tablet Take 30 mg by mouth 2 (two) times daily.  . fluticasone (FLONASE) 50 MCG/ACT nasal spray Place 1 spray into both nostrils 2 (two) times daily.   No current facility-administered medications on file prior to visit.    Allergies  Allergen Reactions  . Codeine     Headache   Past Medical History:  Diagnosis Date  . Allergy    seasonal allergies  . Asthma   . GERD (gastroesophageal reflux disease)    Health Maintenance  Topic Date Due  . INFLUENZA VACCINE  06/13/2018  . COLONOSCOPY  04/16/2020  . TETANUS/TDAP  06/04/2026  . HIV Screening  Completed   Immunization History  Administered Date(s) Administered  . PPD Test 05/04/2014, 05/05/2015, 05/04/2016  . Tdap 05/04/2014, 06/04/2016   Last Colon - 04/16/2017 recc 3 yr f/u per Dr Hilarie Fredrickson  Past Surgical History:  Procedure Laterality Date  . FINGER SURGERY Right    middle finger  . KNEE ARTHROSCOPY Right 2011  . WRIST SURGERY Left    Family History  Problem Relation Age of Onset  . Colon cancer Paternal Grandfather   . Heart disease Maternal Grandfather    Social History   Socioeconomic History  . Marital status: Married  Spouse name: Cassandra  . Number of children: 2 adopted daughters  Occupational History  . Clinical cytogeneticist and Futures trader  Tobacco Use  . Smoking status: Former Smoker    Last attempt to quit: 11/23/1992    Years since quitting: 25.6  . Smokeless tobacco: Current User    Types: Snuff  Substance and Sexual Activity  . Alcohol use: Yes    Alcohol/week: 0.0 standard drinks    Comment: very rarely  . Drug use: No  . Sexual activity: Active e    ROS Constitutional: Denies fever, chills, weight loss/gain, headaches, insomnia,  night sweats or change in appetite. Does c/o fatigue. Eyes: Denies redness, blurred vision, diplopia, discharge, itchy or  watery eyes.  ENT: Denies discharge, congestion, post nasal drip, epistaxis, sore throat, earache, hearing loss, dental pain, Tinnitus, Vertigo, Sinus pain or snoring.  Cardio: Denies chest pain, palpitations, irregular heartbeat, syncope, dyspnea, diaphoresis, orthopnea, PND, claudication or edema Respiratory: denies cough, dyspnea, DOE, pleurisy, hoarseness, laryngitis or wheezing.  Gastrointestinal: Denies dysphagia, heartburn, reflux, water brash, pain, cramps, nausea, vomiting, bloating, diarrhea, constipation, hematemesis, melena, hematochezia, jaundice or hemorrhoids Genitourinary: Denies dysuria, frequency, urgency, nocturia, hesitancy, discharge, hematuria or flank pain Musculoskeletal: Denies arthralgia, myalgia, stiffness, Jt. Swelling, pain, limp or strain/sprain. Denies Falls. Skin: Denies puritis, rash, hives, warts, acne, eczema or change in skin lesion Neuro: No weakness, tremor, incoordination, spasms, paresthesia or pain Psychiatric: Denies confusion, memory loss or sensory loss. Denies Depression. Endocrine: Denies change in weight, skin, hair change, nocturia, and paresthesia, diabetic polys, visual blurring or hyper / hypo glycemic episodes.  Heme/Lymph: No excessive bleeding, bruising or enlarged lymph nodes.  Physical Exam  BP 124/78   Pulse 72   Temp (!) 97.3 F (36.3 C)   Resp 16   Ht 6' 1.5" (1.867 m)   Wt 214 lb 6.4 oz (97.3 kg)   BMI 27.90 kg/m   General Appearance: Well nourished and well groomed and in no apparent distress.  Eyes: PERRLA, EOMs, conjunctiva no swelling or erythema, normal fundi and vessels. Sinuses: No frontal/maxillary tenderness ENT/Mouth: EACs patent / TMs  nl. Nares clear without erythema, swelling, mucoid exudates. Oral hygiene is good. No erythema, swelling, or exudate. Tongue normal, non-obstructing. Tonsils not swollen or erythematous. Hearing normal.  Neck: Supple, thyroid not palpable. No bruits, nodes or JVD. Respiratory:  Respiratory effort normal.  BS equal and clear bilateral without rales, rhonci, wheezing or stridor. Cardio: Heart sounds are normal with regular rate and rhythm and no murmurs, rubs or gallops. Peripheral pulses are normal and equal bilaterally without edema. No aortic or femoral bruits. Chest: symmetric with normal excursions and percussion.  Abdomen: Soft, with Nl bowel sounds. Nontender, no guarding, rebound, hernias, masses, or organomegaly.  Lymphatics: Non tender without lymphadenopathy.  Genitourinary: No hernias.Testes nl. DRE - prostate nl for age - smooth & firm w/o nodules. Musculoskeletal: Full ROM all peripheral extremities, joint stability, 5/5 strength, and normal gait. Skin: Warm and dry without rashes, lesions, cyanosis, clubbing or  ecchymosis.  Neuro: Cranial nerves intact, reflexes equal bilaterally. Normal muscle tone, no cerebellar symptoms. Sensation intact.  Pysch: Alert and oriented X 3 with normal affect, insight and judgment appropriate.   Assessment and Plan  1. Annual Preventative/Screening Exam   2. Elevated BP without diagnosis of hypertension  - EKG 12-Lead - Urinalysis, Routine w reflex microscopic - Microalbumin / creatinine urine ratio - CBC with Differential/Platelet - COMPLETE METABOLIC PANEL WITH GFR - Magnesium - TSH  3. Hyperlipidemia, mixed  -  EKG 12-Lead - Lipid panel - TSH  4. Abnormal glucose  - EKG 12-Lead - Hemoglobin A1c - Insulin, random  5. Vitamin D deficiency   6. Screening for colorectal cancer  - EKG 12-Lead - POC Hemoccult Bld/Stl - VITAMIN D 25 Hydroxyl  7. Prostate cancer screening  - PSA  8. Screening for ischemic heart disease  - EKG 12-Lead - Lipid panel  9. FHx: heart disease  - EKG 12-Lead  10. Former smoker  - EKG 12-Lead  11. Screening examination for pulmonary tuberculosis  - PPD  12. Fatigue, unspecified type  - Iron,Total/Total Iron Binding Cap - Vitamin B12 - Testosterone - CBC  with Differential/Platelet - TSH  13. Medication management  - CBC with Differential/Platelet - Insulin, random - VITAMIN D 25 Hydroxyl      Patient was counseled in prudent diet, weight control to achieve/maintain BMI less than 25, BP monitoring, regular exercise and medications as discussed.  Discussed med effects and SE's. Routine screening labs and tests as requested with regular follow-up as recommended. Over 40 minutes of exam, counseling, chart review and high complex critical decision making was performed

## 2018-06-28 LAB — URINALYSIS, ROUTINE W REFLEX MICROSCOPIC
BILIRUBIN URINE: NEGATIVE
Glucose, UA: NEGATIVE
Hgb urine dipstick: NEGATIVE
KETONES UR: NEGATIVE
Leukocytes, UA: NEGATIVE
Nitrite: NEGATIVE
Protein, ur: NEGATIVE
SPECIFIC GRAVITY, URINE: 1.03 (ref 1.001–1.03)

## 2018-06-28 LAB — COMPLETE METABOLIC PANEL WITH GFR
AG Ratio: 2 (calc) (ref 1.0–2.5)
ALT: 13 U/L (ref 9–46)
AST: 15 U/L (ref 10–40)
Albumin: 4.7 g/dL (ref 3.6–5.1)
Alkaline phosphatase (APISO): 56 U/L (ref 40–115)
BUN: 18 mg/dL (ref 7–25)
CALCIUM: 9.5 mg/dL (ref 8.6–10.3)
CO2: 29 mmol/L (ref 20–32)
CREATININE: 1.06 mg/dL (ref 0.60–1.35)
Chloride: 104 mmol/L (ref 98–110)
GFR, EST NON AFRICAN AMERICAN: 85 mL/min/{1.73_m2} (ref >=60)
GFR, Est African American: 98 mL/min/{1.73_m2} (ref >=60)
GLUCOSE: 79 mg/dL (ref 65–99)
Globulin: 2.4 g/dL (calc) (ref 1.9–3.7)
Potassium: 4.1 mmol/L (ref 3.5–5.3)
Sodium: 142 mmol/L (ref 135–146)
Total Bilirubin: 1.5 mg/dL — ABNORMAL HIGH (ref 0.2–1.2)
Total Protein: 7.1 g/dL (ref 6.1–8.1)

## 2018-06-28 LAB — MICROALBUMIN / CREATININE URINE RATIO
CREATININE, URINE: 259 mg/dL (ref 20–320)
Microalb Creat Ratio: 2 mcg/mg creat (ref <30)
Microalb, Ur: 0.5 mg/dL

## 2018-06-28 LAB — IRON, TOTAL/TOTAL IRON BINDING CAP
%SAT: 34 % (ref 20–48)
Iron: 113 ug/dL (ref 50–180)
TIBC: 336 ug/dL (ref 250–425)

## 2018-06-28 LAB — PSA: PSA: 0.5 ng/mL (ref <=4.0)

## 2018-06-28 LAB — INSULIN, RANDOM: Insulin: 1.6 u[IU]/mL — ABNORMAL LOW (ref 2.0–19.6)

## 2018-06-28 LAB — CBC WITH DIFFERENTIAL/PLATELET
Basophils Absolute: 50 cells/uL (ref 0–200)
Basophils Relative: 0.9 %
EOS PCT: 1.5 %
Eosinophils Absolute: 83 cells/uL (ref 15–500)
HEMATOCRIT: 43.8 % (ref 38.5–50.0)
Hemoglobin: 15.1 g/dL (ref 13.2–17.1)
LYMPHS ABS: 1810 {cells}/uL (ref 850–3900)
MCH: 30.3 pg (ref 27.0–33.0)
MCHC: 34.5 g/dL (ref 32.0–36.0)
MCV: 88 fL (ref 80.0–100.0)
MPV: 9.9 fL (ref 7.5–12.5)
Monocytes Relative: 9.6 %
NEUTROS ABS: 3031 {cells}/uL (ref 1500–7800)
Neutrophils Relative %: 55.1 %
Platelets: 257 10*3/uL (ref 140–400)
RBC: 4.98 10*6/uL (ref 4.20–5.80)
RDW: 12.5 % (ref 11.0–15.0)
Total Lymphocyte: 32.9 %
WBC mixed population: 528 cells/uL (ref 200–950)
WBC: 5.5 10*3/uL (ref 3.8–10.8)

## 2018-06-28 LAB — LIPID PANEL
CHOL/HDL RATIO: 4.6 (calc) (ref <5.0)
CHOLESTEROL: 205 mg/dL — AB (ref <200)
HDL: 45 mg/dL (ref >40)
LDL CHOLESTEROL (CALC): 142 mg/dL — AB
Non-HDL Cholesterol (Calc): 160 mg/dL (calc) — ABNORMAL HIGH (ref <130)
TRIGLYCERIDES: 79 mg/dL (ref <150)

## 2018-06-28 LAB — HEMOGLOBIN A1C
Hgb A1c MFr Bld: 4.8 % of total Hgb (ref <5.7)
Mean Plasma Glucose: 91 (calc)
eAG (mmol/L): 5 (calc)

## 2018-06-28 LAB — TSH: TSH: 1.18 mIU/L (ref 0.40–4.50)

## 2018-06-28 LAB — TESTOSTERONE: Testosterone: 467 ng/dL (ref 250–827)

## 2018-06-28 LAB — VITAMIN D 25 HYDROXY (VIT D DEFICIENCY, FRACTURES): VIT D 25 HYDROXY: 46 ng/mL (ref 30–100)

## 2018-06-28 LAB — MAGNESIUM: Magnesium: 1.9 mg/dL (ref 1.5–2.5)

## 2018-06-28 LAB — VITAMIN B12: Vitamin B-12: 645 pg/mL (ref 200–1100)

## 2018-06-30 ENCOUNTER — Encounter: Payer: Self-pay | Admitting: Internal Medicine

## 2018-07-01 ENCOUNTER — Encounter: Payer: Self-pay | Admitting: *Deleted

## 2018-07-01 LAB — TB SKIN TEST
Induration: 0 mm
TB SKIN TEST: NEGATIVE

## 2018-07-27 ENCOUNTER — Other Ambulatory Visit: Payer: Self-pay | Admitting: Adult Health

## 2018-07-27 DIAGNOSIS — R058 Other specified cough: Secondary | ICD-10-CM

## 2018-07-27 DIAGNOSIS — R05 Cough: Secondary | ICD-10-CM

## 2019-02-05 ENCOUNTER — Other Ambulatory Visit: Payer: Self-pay | Admitting: Internal Medicine

## 2019-07-02 ENCOUNTER — Other Ambulatory Visit: Payer: Self-pay

## 2019-07-02 DIAGNOSIS — R6889 Other general symptoms and signs: Secondary | ICD-10-CM | POA: Diagnosis not present

## 2019-07-02 DIAGNOSIS — Z20822 Contact with and (suspected) exposure to covid-19: Secondary | ICD-10-CM

## 2019-07-03 LAB — NOVEL CORONAVIRUS, NAA: SARS-CoV-2, NAA: NOT DETECTED

## 2019-07-17 ENCOUNTER — Encounter: Payer: Self-pay | Admitting: Internal Medicine

## 2019-07-17 ENCOUNTER — Other Ambulatory Visit: Payer: Self-pay

## 2019-07-17 ENCOUNTER — Ambulatory Visit: Payer: 59 | Admitting: Internal Medicine

## 2019-07-17 VITALS — BP 124/80 | HR 60 | Temp 97.2°F | Resp 16 | Ht 74.0 in | Wt 216.2 lb

## 2019-07-17 DIAGNOSIS — Z0000 Encounter for general adult medical examination without abnormal findings: Secondary | ICD-10-CM | POA: Diagnosis not present

## 2019-07-17 DIAGNOSIS — Z0001 Encounter for general adult medical examination with abnormal findings: Secondary | ICD-10-CM

## 2019-07-17 DIAGNOSIS — Z111 Encounter for screening for respiratory tuberculosis: Secondary | ICD-10-CM

## 2019-07-17 DIAGNOSIS — R7309 Other abnormal glucose: Secondary | ICD-10-CM

## 2019-07-17 DIAGNOSIS — Z125 Encounter for screening for malignant neoplasm of prostate: Secondary | ICD-10-CM | POA: Diagnosis not present

## 2019-07-17 DIAGNOSIS — Z1212 Encounter for screening for malignant neoplasm of rectum: Secondary | ICD-10-CM

## 2019-07-17 DIAGNOSIS — Z8249 Family history of ischemic heart disease and other diseases of the circulatory system: Secondary | ICD-10-CM | POA: Diagnosis not present

## 2019-07-17 DIAGNOSIS — R5383 Other fatigue: Secondary | ICD-10-CM

## 2019-07-17 DIAGNOSIS — E559 Vitamin D deficiency, unspecified: Secondary | ICD-10-CM | POA: Diagnosis not present

## 2019-07-17 DIAGNOSIS — Z79899 Other long term (current) drug therapy: Secondary | ICD-10-CM

## 2019-07-17 DIAGNOSIS — Z136 Encounter for screening for cardiovascular disorders: Secondary | ICD-10-CM

## 2019-07-17 DIAGNOSIS — R030 Elevated blood-pressure reading, without diagnosis of hypertension: Secondary | ICD-10-CM

## 2019-07-17 DIAGNOSIS — E782 Mixed hyperlipidemia: Secondary | ICD-10-CM

## 2019-07-17 DIAGNOSIS — Z87891 Personal history of nicotine dependence: Secondary | ICD-10-CM

## 2019-07-17 DIAGNOSIS — Z1211 Encounter for screening for malignant neoplasm of colon: Secondary | ICD-10-CM

## 2019-07-17 NOTE — Progress Notes (Signed)
Annual  Screening/Preventative Visit  & Comprehensive Evaluation & Examination     This very nice 48 y.o. MWM presents for a Screening /Preventative Visit & comprehensive evaluation and management of multiple medical co-morbidities.  Patient has been followed for HTN, HLD, Prediabetes and Vitamin D Deficiency.     Patient has + FHx Colon Ca in pGF and in June 2018, he had a Colonoscopy with Dr Hilarie Fredrickson finding  # 5 polyps and was recommended 3 years f/u Colon (due June 2021).     Mild labile HTN predates since 2007 and patient has been followed expectantly. Patient's BP has been controlled at home.  Today's BP is at goal - 124/80. Patient denies any cardiac symptoms as chest pain, palpitations, shortness of breath, dizziness or ankle swelling.     Patient's hyperlipidemia is not controlled with diet and patient is reticent to take Cholesterol meds. Last lipids were not at goal: Lab Results  Component Value Date   CHOL 205 (H) 06/27/2018   HDL 45 06/27/2018   LDLCALC 142 (H) 06/27/2018   TRIG 79 06/27/2018   CHOLHDL 4.6 06/27/2018      Patient is proactively monitored for glucose intolerance and patient denies reactive hypoglycemic symptoms, visual blurring, diabetic polys or paresthesias. Last A1c was Normal & at goal: Lab Results  Component Value Date   HGBA1C 4.8 06/27/2018       Finally, patient has history of Vitamin D Deficiency ("33" / 2008)  and last vitamin D was still low (goal 70-100): Lab Results  Component Value Date   VD25OH 46 06/27/2018   Current Outpatient Medications on File Prior to Visit  Medication Sig  . albuterol (PROVENTIL HFA;VENTOLIN HFA) 108 (90 Base) MCG/ACT inhaler Use 1 to 2 inhalations 15 minutes apart every 4 hours as needed to rescue Asthma  . azelastine (ASTELIN) 0.1 % nasal spray USE 1-2 SPRAYS IN EACH NOSTRIL TWICE A DAY  . fexofenadine (ALLEGRA) 30 MG tablet Take 30 mg by mouth 2 (two) times daily.  . fluticasone (FLONASE) 50 MCG/ACT nasal spray  Place 1 spray into both nostrils 2 (two) times daily.   No current facility-administered medications on file prior to visit.    Allergies  Allergen Reactions  . Codeine     Headache   Past Medical History:  Diagnosis Date  . Allergy    seasonal allergies  . Asthma   . GERD (gastroesophageal reflux disease)    Health Maintenance  Topic Date Due  . INFLUENZA VACCINE  06/14/2019  . COLONOSCOPY  04/16/2020  . TETANUS/TDAP  06/04/2026  . HIV Screening  Completed   Immunization History  Administered Date(s) Administered  . PPD Test 05/04/2014, 05/05/2015, 05/04/2016, 06/27/2018  . Tdap 05/04/2014, 06/04/2016    June 2018 Colonoscopy - Dr Hilarie Fredrickson finding  # 5 polyps - recommended 3 years f/u Colon (+ FHx Colon Ca in pGF).  Past Surgical History:  Procedure Laterality Date  . FINGER SURGERY Right    middle finger  . KNEE ARTHROSCOPY Right 2011  . WRIST SURGERY Left    Family History  Problem Relation Age of Onset  . Colon cancer Paternal Grandfather   . Heart disease Maternal Grandfather    Social History   Socioeconomic History  . Marital status: Married    Spouse name: Cassandra  . Number of children: 2 adopted daughters  Occupational History  . Clinical cytogeneticist and Futures trader  Tobacco Use  . Smoking status: Former Smoker    Quit date: 11/23/1992  Years since quitting: 26.6  . Smokeless tobacco: Current User    Types: Snuff  Substance and Sexual Activity  . Alcohol use: Yes, very rarely  . Drug use: No  . Sexual activity: Active    ROS Constitutional: Denies fever, chills, weight loss/gain, headaches, insomnia,  night sweats or change in appetite. Does c/o fatigue. Eyes: Denies redness, blurred vision, diplopia, discharge, itchy or watery eyes.  ENT: Denies discharge, congestion, post nasal drip, epistaxis, sore throat, earache, hearing loss, dental pain, Tinnitus, Vertigo, Sinus pain or snoring.  Cardio: Denies chest pain, palpitations, irregular  heartbeat, syncope, dyspnea, diaphoresis, orthopnea, PND, claudication or edema Respiratory: denies cough, dyspnea, DOE, pleurisy, hoarseness, laryngitis or wheezing.  Gastrointestinal: Denies dysphagia, heartburn, reflux, water brash, pain, cramps, nausea, vomiting, bloating, diarrhea, constipation, hematemesis, melena, hematochezia, jaundice or hemorrhoids Genitourinary: Denies dysuria, frequency, urgency, nocturia, hesitancy, discharge, hematuria or flank pain Musculoskeletal: Denies arthralgia, myalgia, stiffness, Jt. Swelling, pain, limp or strain/sprain. Denies Falls. Skin: Denies puritis, rash, hives, warts, acne, eczema or change in skin lesion Neuro: No weakness, tremor, incoordination, spasms, paresthesia or pain Psychiatric: Denies confusion, memory loss or sensory loss. Denies Depression. Endocrine: Denies change in weight, skin, hair change, nocturia, and paresthesia, diabetic polys, visual blurring or hyper / hypo glycemic episodes.  Heme/Lymph: No excessive bleeding, bruising or enlarged lymph nodes.  Physical Exam  BP 124/80   Pulse 60   Temp (!) 97.2 F (36.2 C)   Resp 16   Ht 6\' 2"  (1.88 m)   Wt 216 lb 3.2 oz (98.1 kg)   BMI 27.76 kg/m   General Appearance: Well nourished and well groomed and in no apparent distress.  Eyes: PERRLA, EOMs, conjunctiva no swelling or erythema, normal fundi and vessels. Sinuses: No frontal/maxillary tenderness ENT/Mouth: EACs patent / TMs  nl. Nares clear without erythema, swelling, mucoid exudates. Oral hygiene is good. No erythema, swelling, or exudate. Tongue normal, non-obstructing. Tonsils not swollen or erythematous. Hearing normal.  Neck: Supple, thyroid not palpable. No bruits, nodes or JVD. Respiratory: Respiratory effort normal.  BS equal and clear bilateral without rales, rhonci, wheezing or stridor. Cardio: Heart sounds are normal with regular rate and rhythm and no murmurs, rubs or gallops. Peripheral pulses are normal and  equal bilaterally without edema. No aortic or femoral bruits. Chest: symmetric with normal excursions and percussion.  Abdomen: Soft, with Nl bowel sounds. Nontender, no guarding, rebound, hernias, masses, or organomegaly.  Lymphatics: Non tender without lymphadenopathy.  Musculoskeletal: Full ROM all peripheral extremities, joint stability, 5/5 strength, and normal gait. Skin: Warm and dry without rashes, lesions, cyanosis, clubbing or  ecchymosis.  Neuro: Cranial nerves intact, reflexes equal bilaterally. Normal muscle tone, no cerebellar symptoms. Sensation intact.  Pysch: Alert and oriented X 3 with normal affect, insight and judgment appropriate.   Assessment and Plan  1. Annual Preventative/Screening Exam   2. Elevated BP without diagnosis of hypertension  - EKG 12-Lead - Urinalysis, Routine w reflex microscopic - Microalbumin / creatinine urine ratio - CBC with Differential/Platelet - COMPLETE METABOLIC PANEL WITH GFR - Magnesium - TSH  3. Hyperlipidemia, mixed  - EKG 12-Lead - Lipid panel - TSH  4. Abnormal glucose  - EKG 12-Lead - Hemoglobin A1c - Insulin, random  5. Vitamin D deficiency  - VITAMIN D 25 Hydroxyl  6. Screening examination for pulmonary tuberculosis  - TB Skin Test  7. Screening for colorectal cancer  - POC Hemoccult Bld/Stl  8. Prostate cancer screening  - PSA  9. Screening for ischemic  heart disease  - EKG 12-Lead  10. FHx: heart disease  - EKG 12-Lead  11. Former smoker  - EKG 12-Lead  12. Fatigue  - Iron,Total/Total Iron Binding Cap - Vitamin B12 - Testosterone - CBC with Differential/Platelet - TSH  13. Medication management  - Urinalysis, Routine w reflex microscopic - Microalbumin / creatinine urine ratio - CBC with Differential/Platelet - COMPLETE METABOLIC PANEL WITH GFR - Magnesium - Lipid panel - TSH - Hemoglobin A1c - Insulin, random - VITAMIN D 25 Hydroxyl       Patient was counseled in prudent  diet, weight control to achieve/maintain BMI less than 25, BP monitoring, regular exercise and medications as discussed.  Discussed med effects and SE's. Routine screening labs and tests as requested with regular follow-up as recommended. Over 40 minutes of exam, counseling, chart review and high complex critical decision making was performed   Kirtland Bouchard, MD

## 2019-07-17 NOTE — Patient Instructions (Signed)

## 2019-07-18 LAB — LIPID PANEL
Cholesterol: 214 mg/dL — ABNORMAL HIGH (ref <200)
HDL: 44 mg/dL (ref >=40)
LDL Cholesterol (Calc): 152 mg/dL (calc) — ABNORMAL HIGH
Non-HDL Cholesterol (Calc): 170 mg/dL (calc) — ABNORMAL HIGH (ref <130)
Total CHOL/HDL Ratio: 4.9 (calc) (ref <5.0)
Triglycerides: 77 mg/dL (ref <150)

## 2019-07-18 LAB — COMPLETE METABOLIC PANEL WITH GFR
AG Ratio: 2.1 (calc) (ref 1.0–2.5)
ALT: 12 U/L (ref 9–46)
AST: 14 U/L (ref 10–40)
Albumin: 4.7 g/dL (ref 3.6–5.1)
Alkaline phosphatase (APISO): 47 U/L (ref 36–130)
BUN: 17 mg/dL (ref 7–25)
CO2: 26 mmol/L (ref 20–32)
Calcium: 9.5 mg/dL (ref 8.6–10.3)
Chloride: 104 mmol/L (ref 98–110)
Creat: 1.13 mg/dL (ref 0.60–1.35)
GFR, Est African American: 90 mL/min/{1.73_m2} (ref >=60)
GFR, Est Non African American: 78 mL/min/{1.73_m2} (ref >=60)
Globulin: 2.2 g/dL (calc) (ref 1.9–3.7)
Glucose, Bld: 79 mg/dL (ref 65–99)
Potassium: 4.4 mmol/L (ref 3.5–5.3)
Sodium: 139 mmol/L (ref 135–146)
Total Bilirubin: 1.5 mg/dL — ABNORMAL HIGH (ref 0.2–1.2)
Total Protein: 6.9 g/dL (ref 6.1–8.1)

## 2019-07-18 LAB — CBC WITH DIFFERENTIAL/PLATELET
Absolute Monocytes: 443 cells/uL (ref 200–950)
Basophils Absolute: 59 cells/uL (ref 0–200)
Basophils Relative: 1 %
Eosinophils Absolute: 83 cells/uL (ref 15–500)
Eosinophils Relative: 1.4 %
HCT: 43.8 % (ref 38.5–50.0)
Hemoglobin: 15.3 g/dL (ref 13.2–17.1)
Lymphs Abs: 2047 cells/uL (ref 850–3900)
MCH: 30.5 pg (ref 27.0–33.0)
MCHC: 34.9 g/dL (ref 32.0–36.0)
MCV: 87.4 fL (ref 80.0–100.0)
MPV: 9.5 fL (ref 7.5–12.5)
Monocytes Relative: 7.5 %
Neutro Abs: 3269 cells/uL (ref 1500–7800)
Neutrophils Relative %: 55.4 %
Platelets: 251 10*3/uL (ref 140–400)
RBC: 5.01 10*6/uL (ref 4.20–5.80)
RDW: 12.7 % (ref 11.0–15.0)
Total Lymphocyte: 34.7 %
WBC: 5.9 10*3/uL (ref 3.8–10.8)

## 2019-07-18 LAB — TESTOSTERONE: Testosterone: 716 ng/dL (ref 250–827)

## 2019-07-18 LAB — INSULIN, RANDOM: Insulin: 2.5 u[IU]/mL

## 2019-07-18 LAB — HEMOGLOBIN A1C
Hgb A1c MFr Bld: 4.6 % of total Hgb (ref <5.7)
Mean Plasma Glucose: 85 (calc)
eAG (mmol/L): 4.7 (calc)

## 2019-07-18 LAB — VITAMIN D 25 HYDROXY (VIT D DEFICIENCY, FRACTURES): Vit D, 25-Hydroxy: 97 ng/mL (ref 30–100)

## 2019-07-18 LAB — VITAMIN B12: Vitamin B-12: 574 pg/mL (ref 200–1100)

## 2019-07-18 LAB — IRON, TOTAL/TOTAL IRON BINDING CAP
%SAT: 42 % (calc) (ref 20–48)
Iron: 140 ug/dL (ref 50–180)
TIBC: 332 mcg/dL (calc) (ref 250–425)

## 2019-07-18 LAB — PSA: PSA: 0.6 ng/mL (ref <=4.0)

## 2019-07-18 LAB — TSH: TSH: 1.32 mIU/L (ref 0.40–4.50)

## 2019-07-18 LAB — MAGNESIUM: Magnesium: 2 mg/dL (ref 1.5–2.5)

## 2019-07-19 ENCOUNTER — Other Ambulatory Visit: Payer: Self-pay | Admitting: Internal Medicine

## 2019-07-19 DIAGNOSIS — E782 Mixed hyperlipidemia: Secondary | ICD-10-CM

## 2019-07-20 ENCOUNTER — Encounter: Payer: Self-pay | Admitting: Internal Medicine

## 2019-08-09 ENCOUNTER — Other Ambulatory Visit: Payer: Self-pay | Admitting: Internal Medicine

## 2019-08-11 ENCOUNTER — Encounter (HOSPITAL_COMMUNITY): Payer: Self-pay

## 2019-08-11 ENCOUNTER — Emergency Department (HOSPITAL_COMMUNITY): Payer: 59

## 2019-08-11 ENCOUNTER — Emergency Department (HOSPITAL_COMMUNITY)
Admission: EM | Admit: 2019-08-11 | Discharge: 2019-08-11 | Disposition: A | Discharge status: Home / Self Care | Payer: 59 | Attending: Emergency Medicine | Admitting: Emergency Medicine

## 2019-08-11 ENCOUNTER — Other Ambulatory Visit: Payer: Self-pay

## 2019-08-11 ENCOUNTER — Other Ambulatory Visit: Payer: Self-pay | Admitting: Cardiology

## 2019-08-11 DIAGNOSIS — R002 Palpitations: Secondary | ICD-10-CM | POA: Diagnosis not present

## 2019-08-11 DIAGNOSIS — R200 Anesthesia of skin: Secondary | ICD-10-CM | POA: Insufficient documentation

## 2019-08-11 DIAGNOSIS — Z79899 Other long term (current) drug therapy: Secondary | ICD-10-CM | POA: Diagnosis not present

## 2019-08-11 DIAGNOSIS — R42 Dizziness and giddiness: Secondary | ICD-10-CM | POA: Insufficient documentation

## 2019-08-11 DIAGNOSIS — R079 Chest pain, unspecified: Secondary | ICD-10-CM | POA: Diagnosis not present

## 2019-08-11 DIAGNOSIS — K219 Gastro-esophageal reflux disease without esophagitis: Secondary | ICD-10-CM | POA: Insufficient documentation

## 2019-08-11 DIAGNOSIS — I10 Essential (primary) hypertension: Secondary | ICD-10-CM | POA: Insufficient documentation

## 2019-08-11 DIAGNOSIS — Z87891 Personal history of nicotine dependence: Secondary | ICD-10-CM | POA: Diagnosis not present

## 2019-08-11 LAB — TROPONIN I (HIGH SENSITIVITY)
Troponin I (High Sensitivity): 3 ng/L (ref <18)
Troponin I (High Sensitivity): 4 ng/L (ref <18)

## 2019-08-11 LAB — BASIC METABOLIC PANEL
Anion gap: 9 (ref 5–15)
BUN: 14 mg/dL (ref 6–20)
CO2: 25 mmol/L (ref 22–32)
Calcium: 9.2 mg/dL (ref 8.9–10.3)
Chloride: 104 mmol/L (ref 98–111)
Creatinine, Ser: 1.06 mg/dL (ref 0.61–1.24)
GFR calc Af Amer: >60 mL/min (ref >60)
GFR calc non Af Amer: >60 mL/min (ref >60)
Glucose, Bld: 108 mg/dL — ABNORMAL HIGH (ref 70–99)
Potassium: 4.1 mmol/L (ref 3.5–5.1)
Sodium: 138 mmol/L (ref 135–145)

## 2019-08-11 LAB — CBC
HCT: 47.1 % (ref 39.0–52.0)
Hemoglobin: 16.8 g/dL (ref 13.0–17.0)
MCH: 30.3 pg (ref 26.0–34.0)
MCHC: 35.7 g/dL (ref 30.0–36.0)
MCV: 85 fL (ref 80.0–100.0)
Platelets: 284 10*3/uL (ref 150–400)
RBC: 5.54 MIL/uL (ref 4.22–5.81)
RDW: 12.3 % (ref 11.5–15.5)
WBC: 5.3 10*3/uL (ref 4.0–10.5)
nRBC: 0 % (ref 0.0–0.2)

## 2019-08-11 LAB — MAGNESIUM: Magnesium: 2 mg/dL (ref 1.7–2.4)

## 2019-08-11 MED ORDER — SODIUM CHLORIDE 0.9% FLUSH: 3.0000 mL | Freq: Once | INTRAVENOUS | Status: DC

## 2019-08-11 NOTE — ED Provider Notes (Signed)
[facility] EMERGENCY DEPARTMENT Provider Note   CSN: [id number] Arrival date & time: 08/11/19  1058   History   Chief Complaint Chief Complaint  Patient presents with   Palpitations   HPI Bradley Roy is a 48 y.o. male with past medical history significant for GERD, asthma, hypertension, hyperlipidemia who presents for evaluation of palpitations. Patient states he was awoken this morning with sensation of his heart racing, diaphoresis and dizziness. Patient states symptom lasted approximately an hour states his wife took his blood pressure which was "normal" at that time with heart rate in the 60s. Patient states symptoms resolved when he went off to work.  Patient states he continues to have intermittent episodes of dizziness with intermittent paresthesias to bilateral upper extremities. Two episodes occurred with palpitations. No symptoms over the last 6 hours PTA. He denies syncope.  He denies any facial droop, unilateral weakness, slurred speech, chest pain, shortness of breath, sudden onset thunderclap headache, abdominal pain, diarrhea, dysuria, vision changes. Patient states he has no symptoms or complaints at this time.  Denies prior similar symptoms.  History obtained from patient and past medical records.  No interpreter was used.     HPI  Past Medical History:  Diagnosis Date   Allergy    seasonal allergies   Asthma    GERD (gastroesophageal reflux disease)     Patient Active Problem List   Diagnosis Date Noted   Other abnormal glucose 05/04/2016   Hyperlipidemia 05/04/2016   Elevated BP 05/05/2015   Vitamin D deficiency 05/03/2014   Medication management 05/03/2014   Asthma     Past Surgical History:  Procedure Laterality Date   FINGER SURGERY Right    middle finger   KNEE ARTHROSCOPY Right 2011   WRIST SURGERY Left       Home Medications    Prior to Admission medications   Medication Sig Start Date End Date Taking?  Authorizing Provider  albuterol (PROVENTIL HFA;VENTOLIN HFA) 108 (90 Base) MCG/ACT inhaler Use 1 to 2 inhalations 15 minutes apart every 4 hours as needed to rescue Asthma 07/27/18   Unk Pinto, MD  azelastine (ASTELIN) 0.1 % nasal spray USE 1-2 SPRAYS IN EACH NOSTRIL TWICE A DAY 08/09/19   Unk Pinto, MD  fexofenadine (ALLEGRA) 30 MG tablet Take 30 mg by mouth 2 (two) times daily.    [provider]  fluticasone (FLONASE) 50 MCG/ACT nasal spray Place 1 spray into both nostrils 2 (two) times daily. 07/20/16   Unk Pinto, MD    Family History Family History  Problem Relation Age of Onset   Colon cancer Paternal Grandfather    Heart disease Maternal Grandfather     Social History Social History   Tobacco Use   Smoking status: Former Smoker    Quit date: 11/23/1992    Years since quitting: 26.7   Smokeless tobacco: Current User    Types: Snuff  Substance Use Topics   Alcohol use: Yes    Alcohol/week: 0.0 standard drinks    Comment: very rarely   Drug use: No     Allergies   Codeine   Review of Systems Review of Systems  Constitutional: Negative.   HENT: Negative.   Eyes: Negative.   Respiratory: Negative.   Cardiovascular: Positive for palpitations. Negative for chest pain and leg swelling.  Gastrointestinal: Negative.   Genitourinary: Negative.   Musculoskeletal: Negative.   Skin: Negative.   Neurological: Positive for dizziness, light-headedness and numbness. Negative for tremors, seizures, syncope,  facial asymmetry, speech difficulty, weakness and headaches.  All other systems reviewed and are negative.  Physical Exam Updated Vital Signs BP 107/78    Pulse (!) 50    Temp 98.1 F (36.7 C) (Oral)    Resp 17    SpO2 100%   Physical Exam Vitals signs and nursing note reviewed.  Constitutional:      General: He is not in acute distress.    Appearance: He is well-developed. He is not ill-appearing, toxic-appearing or diaphoretic.  HENT:      Head: Normocephalic and atraumatic.     Jaw: There is normal jaw occlusion.  Eyes:     Extraocular Movements: Extraocular movements intact.     Pupils: Pupils are equal, round, and reactive to light.     Comments: No horizontal, vertical or rotational nystagmus   Neck:     Musculoskeletal: Full passive range of motion without pain, normal range of motion and neck supple.     Trachea: Trachea and phonation normal.     Comments: Full active and passive ROM without pain No midline or paraspinal tenderness No nuchal rigidity or meningeal signs  Cardiovascular:     Rate and Rhythm: Normal rate and regular rhythm.     Pulses: Normal pulses.     Heart sounds: Normal heart sounds. No murmur. No gallop.      Comments: No murmur, rubs or gallops.  Heart rate 70 on evaluation. Pulmonary:     Effort: Pulmonary effort is normal. No respiratory distress.     Breath sounds: Normal breath sounds. No wheezing, rhonchi or rales.     Comments: Speaks in full sentences without difficulty. Chest:     Comments: Nontender without crepitus, overlying skin changes. Abdominal:     General: Bowel sounds are normal. There is no distension.     Palpations: Abdomen is soft.     Tenderness: There is no abdominal tenderness. There is no guarding or rebound.     Comments: Soft, nontender without rebound or guarding.  Musculoskeletal: Normal range of motion.     Comments: Moves all 4 extremities equally and without difficulty.  Calves nontender with edema, erythema, ecchymosis or warmth.  Skin:    General: Skin is warm and dry.     Capillary Refill: Capillary refill takes less than 2 seconds.     Comments: Brisk capillary refill.  No rashes or lesions.  Neurological:     Mental Status: He is alert.     Comments: Mental Status:  Alert, oriented, thought content appropriate. Speech fluent without evidence of aphasia. Able to follow 2 step commands without difficulty.  Cranial Nerves:  II:  Peripheral visual  fields grossly normal, pupils equal, round, reactive to light III,IV, VI: ptosis not present, extra-ocular motions intact bilaterally  V,VII: smile symmetric, facial light touch sensation equal VIII: hearing grossly normal bilaterally  IX,X: midline uvula rise  XI: bilateral shoulder shrug equal and strong XII: midline tongue extension  Motor:  5/5 in upper and lower extremities bilaterally including strong and equal grip strength and dorsiflexion/plantar flexion Sensory: Pinprick and light touch normal in all extremities.  Deep Tendon Reflexes: 2+ and symmetric  Cerebellar: normal finger-to-nose with bilateral upper extremities Gait: normal gait and balance CV: distal pulses palpable throughout       ED Treatments / Results  Labs (all labs ordered are listed, but only abnormal results are displayed) Labs Reviewed  BASIC METABOLIC PANEL - Abnormal; Notable for the following components:  Result Value   Glucose, Bld 108 (*)    All other components within normal limits  CBC  MAGNESIUM  TROPONIN I (HIGH SENSITIVITY)  TROPONIN I (HIGH SENSITIVITY)    EKG EKG Interpretation  Date/Time:  Monday August 11 2019 11:06:36 EDT Ventricular Rate:  83 PR Interval:  132 QRS Duration: 74 QT Interval:  360 QTC Calculation: 423 R Axis:   58 Text Interpretation:  Normal sinus rhythm Normal ECG No old tracing to compare Confirmed by Isla Pence [phone]) on 08/11/2019 11:35:12 AM   Radiology Dg Chest 2 View  Result Date: 08/11/2019 CLINICAL DATA:  Chest pain. EXAM: CHEST - 2 VIEW COMPARISON:  None. FINDINGS: Lungs are adequately inflated without consolidation or effusion. Cardiomediastinal silhouette is normal. Old right anterior rib fractures. Mild degenerative change of the spine. IMPRESSION: No active cardiopulmonary disease. Electronically Signed   By: Marin Olp M.D.   On: 08/11/2019 13:19   Ct Head Wo Contrast  Result Date: 08/11/2019 CLINICAL DATA:  Dizziness, vertigo  EXAM: CT HEAD WITHOUT CONTRAST TECHNIQUE: Contiguous axial images were obtained from the base of the skull through the vertex without intravenous contrast. COMPARISON:  None. FINDINGS: Brain: No evidence of acute infarction, hemorrhage, hydrocephalus, extra-axial collection or mass lesion/mass effect. Vascular: No hyperdense vessel or unexpected calcification. Skull: Normal. Negative for fracture or focal lesion. Sinuses/Orbits: No acute finding. Other: None. IMPRESSION: No acute intracranial findings. Electronically Signed   By: Davina Poke M.D.   On: 08/11/2019 12:24    Procedures Procedures (including critical care time)  Medications Ordered in ED Medications  sodium chloride flush (NS) 0.9 % injection 3 mL (3 mLs Intravenous Not Given 08/11/19 1133)   Initial Impression / Assessment and Plan / ED Course  I have reviewed the triage vital signs and the nursing notes.  Pertinent labs & imaging results that were available during my care of the patient were reviewed by me and considered in my medical decision making (see chart for details).  48 year old male patient otherwise well presents for evaluation of palpitations, dizziness and intermittent paresthesias (morning, 8 hours PTA.  He has a nonfocal neurologic exam without deficits.  Heart and lungs clear.  No evidence of DVT on exam.  No evidence of fluid overload.  No prior history of cardiac, lung, neurologic complications.  Does have history of hyperlipidemia and transiently elevated hypertension per PCP note.  Denies any current symptoms on exam.  Labs obtained from triage.  Will obtain head CT due to intermittent bilateral  paresthesias however low suspicion for CVA, dissection. Sx not consistent with TIA.  Wells criteria low risk, PERC negative. Heart score 2- age, risk factors  Labs and imaging personally reviewed: Delta trop negative.  EKG without ST/T changes CBC without leukocytosis BMP without acute findings CT head  negative DG chest without infiltrates, cardiomegaly, edema, pneumothorax  Reevaluated patient.  He denies any current symptoms.  He is requesting DC home.  Shared decision making with patient for outpatient management with close cardiology follow-up versus inpatient management.  He declines inpatient management or additional testing at this time.  Discussed risk versus benefit as to cannot rule out fatal arrhythmia as cause of his palpitations.  He voiced understanding of risks and would like to follow-up outpatient. Given he is asymptomatic and has had symptoms throughout his time in the emergency department I feel this is reasonable at this time. Low suspicion for ACS, PE, dissection. Orthostatic VS negative. Patient does not want IV placed or IVF at  this time.  Consulted with Phylliss Bob, NP with cardiology. Will place referral to cardiology outpatient and send out a holter monitor. Chest pain is not likely of cardiac or pulmonary etiology d/t presentation, PERC negative, VSS, no tracheal deviation, no JVD or new murmur, RRR, breath sounds equal bilaterally, EKG without acute abnormalities, negative troponin, and negative CXR. Pt has been advised to return to the ED if CP becomes exertional, associated with diaphoresis or nausea, radiates to left jaw/arm, worsens or becomes concerning in any way. Pt appears reliable for follow up and is agreeable to discharge.   Patient with  normal neurologic exam.  No vertical or rotational nystagmus. Patient normal finger-nose and normal gait.  No slurred speech renal or weakness.  Doubt CVA or other central cause of vertigo.   The patient has been appropriately medically screened and/or stabilized in the ED. I have low suspicion for any other emergent medical condition which would require further screening, evaluation or treatment in the ED or require inpatient management.  Patient is hemodynamically stable and in no acute distress.  Patient able to ambulate in  department prior to ED.  Evaluation does not show acute pathology that would require ongoing or additional emergent interventions while in the emergency department or further inpatient treatment.  I have discussed the diagnosis with the patient and answered all questions.  Pain is been managed while in the emergency department and patient has no further complaints prior to discharge.  Patient is comfortable with plan discussed in room and is stable for discharge at this time.  I have discussed strict return precautions for returning to the emergency department.  Patient was encouraged to follow-up with PCP/specialist refer to at discharge.      Final Clinical Impressions(s) / ED Diagnoses   Final diagnoses:  Palpitations  Dizziness    ED Discharge Orders    None       Liviana Mills A, PA-C 08/11/19 1451    Isla Pence, MD 08/11/19 1456

## 2019-08-11 NOTE — Discharge Instructions (Signed)
Follow up with cardiology.  Their contact information is listed on your discharge paperwork.  If you do not hear from them within 24 hours please call to schedule an appointment.  If you develop worsening palpitations, chest pain, shortness of breath, unilateral weakness, severe dizziness please seek reevaluation the emergency department.

## 2019-08-11 NOTE — ED Notes (Signed)
ED Provider at bedside. 

## 2019-08-11 NOTE — ED Notes (Signed)
Patient transported to X-ray 

## 2019-08-11 NOTE — ED Notes (Signed)
Patient verbalizes understanding of discharge instructions. Opportunity for questioning and answers were provided. Armband removed by staff, pt discharged from ED ambulatory to home.  

## 2019-08-11 NOTE — Progress Notes (Signed)
Pt seen in ED for palpitations. Per Britni Henderly, PA pt needs heart monitor and referral to [facility] for evaluation of palpitations. Order placed for 7 day monitor.

## 2019-08-11 NOTE — ED Triage Notes (Signed)
Patient complains of palpitations with hearty racing and diaphoresis and dizziness since 0500. Reports that the symptoms have gotten much better as the morning has progressed. No other associated symptoms. Alert and oriented

## 2019-08-13 ENCOUNTER — Telehealth: Payer: Self-pay | Admitting: *Deleted

## 2019-08-13 NOTE — Telephone Encounter (Signed)
14 day ZIO AT long term live telemetry monitor to be mailed to the patients home.  Instructions reviewed briefly as they are not included in the monitor kit.

## 2019-08-26 ENCOUNTER — Ambulatory Visit: Payer: Self-pay | Admitting: Cardiology

## 2020-02-24 DIAGNOSIS — D485 Neoplasm of uncertain behavior of skin: Secondary | ICD-10-CM | POA: Diagnosis not present

## 2020-02-24 DIAGNOSIS — C44329 Squamous cell carcinoma of skin of other parts of face: Secondary | ICD-10-CM | POA: Diagnosis not present

## 2020-04-05 DIAGNOSIS — D0439 Carcinoma in situ of skin of other parts of face: Secondary | ICD-10-CM | POA: Diagnosis not present

## 2020-07-01 DIAGNOSIS — Z85828 Personal history of other malignant neoplasm of skin: Secondary | ICD-10-CM | POA: Diagnosis not present

## 2020-07-01 DIAGNOSIS — L570 Actinic keratosis: Secondary | ICD-10-CM | POA: Diagnosis not present

## 2020-07-01 DIAGNOSIS — L905 Scar conditions and fibrosis of skin: Secondary | ICD-10-CM | POA: Diagnosis not present

## 2020-07-01 DIAGNOSIS — L814 Other melanin hyperpigmentation: Secondary | ICD-10-CM | POA: Diagnosis not present

## 2020-07-01 DIAGNOSIS — D485 Neoplasm of uncertain behavior of skin: Secondary | ICD-10-CM | POA: Diagnosis not present

## 2020-07-01 DIAGNOSIS — C4441 Basal cell carcinoma of skin of scalp and neck: Secondary | ICD-10-CM | POA: Diagnosis not present

## 2020-07-01 DIAGNOSIS — C44612 Basal cell carcinoma of skin of right upper limb, including shoulder: Secondary | ICD-10-CM | POA: Diagnosis not present

## 2020-08-17 ENCOUNTER — Encounter: Payer: Self-pay | Admitting: Internal Medicine

## 2020-08-17 NOTE — Patient Instructions (Signed)

## 2020-08-17 NOTE — Progress Notes (Signed)
Annual  Screening/Preventative Visit  & Comprehensive Evaluation & Examination     This very nice 49 y.o.  MWM presents for a Screening /Preventative Visit & comprehensive evaluation and management of multiple medical co-morbidities.  Patient has been followed for HTN, HLD, Prediabetes and Vitamin D Deficiency.     Patient has been followed since 2007 for mild labile HTN. Patient's BP has been controlled at home.  Today's BP is at goal -  124/72. Patient denies any cardiac symptoms as chest pain, palpitations, shortness of breath, dizziness or ankle swelling.      Patient's hyperlipidemia is not controlled with diet and patient is very reticent to take cholesterol meds.  Last lipids were not at goal:  Lab Results  Component Value Date   CHOL 214 (H) 07/17/2019   HDL 44 07/17/2019   LDLCALC 152 (H) 07/17/2019   TRIG 77 07/17/2019   CHOLHDL 4.9 07/17/2019       Patient is followed expectantly for glucose intolerance and patient denies reactive hypoglycemic symptoms, visual blurring, diabetic polys or paresthesias. Last A1c was Normal & at goal:  Lab Results  Component Value Date   HGBA1C 4.6 07/17/2019        Finally, patient has history of Vitamin D Deficiency ("33" /2008) and last vitamin D was at goal:  Lab Results  Component Value Date   VD25OH 97 07/17/2019    Current Outpatient Medications on File Prior to Visit  Medication Sig  . albuterol (PROVENTIL HFA;VENTOLIN HFA) 108 (90 Base) MCG/ACT inhaler Use 1 to 2 inhalations 15 minutes apart every 4 hours as needed to rescue Asthma  . azelastine (ASTELIN) 0.1 % nasal spray USE 1-2 SPRAYS IN EACH NOSTRIL TWICE A DAY  . fexofenadine (ALLEGRA) 30 MG tablet Take 30 mg by mouth 2 (two) times daily.  . fluticasone (FLONASE) 50 MCG/ACT nasal spray Place 1 spray into both nostrils 2 (two) times daily.   No current facility-administered medications on file prior to visit.   Allergies  Allergen Reactions  . Codeine      Headache   Past Medical History:  Diagnosis Date  . Allergy    seasonal allergies  . Asthma   . GERD (gastroesophageal reflux disease)    Health Maintenance  Topic Date Due  . COLONOSCOPY  04/16/2020  . INFLUENZA VACCINE  Never done  . TETANUS/TDAP  06/04/2026  . Hepatitis C Screening  Completed  . HIV Screening  Completed   Immunization History  Administered Date(s) Administered  . PPD Test 05/04/2014, 05/05/2015, 05/04/2016, 06/27/2018, 07/17/2019  . Tdap 05/04/2014, 06/04/2016   Last Colon - 04/16/2017 - Dr Hilarie Fredrickson - Recc 3 yr f/u - overdue since 04/2020  Past Surgical History:  Procedure Laterality Date  . FINGER SURGERY Right    middle finger  . KNEE ARTHROSCOPY Right 2011  . WRIST SURGERY Left    Family History  Problem Relation Age of Onset  . Colon cancer Paternal Grandfather   . Heart disease Maternal Grandfather    Social History   Socioeconomic History  . Marital status: Married    Spouse name: Larey Seat  . Number of children: 2 adopted daughters  Occupational History  . Not on file  Tobacco Use  . Smoking status: Former Smoker    Quit date: 11/23/1992    Years since quitting: 27.7  . Smokeless tobacco: Current User    Types: Snuff  Vaping Use  . Vaping Use: Never used  Substance and Sexual Activity  . Alcohol  use: Yes    Alcohol/week: 0.0 standard drinks    Comment: very rarely  . Drug use: No  . Sexual activity: Not on file     ROS Constitutional: Denies fever, chills, weight loss/gain, headaches, insomnia,  night sweats or change in appetite. Does c/o fatigue. Eyes: Denies redness, blurred vision, diplopia, discharge, itchy or watery eyes.  ENT: Denies discharge, congestion, post nasal drip, epistaxis, sore throat, earache, hearing loss, dental pain, Tinnitus, Vertigo, Sinus pain or snoring.  Cardio: Denies chest pain, palpitations, irregular heartbeat, syncope, dyspnea, diaphoresis, orthopnea, PND, claudication or edema Respiratory: denies  cough, dyspnea, DOE, pleurisy, hoarseness, laryngitis or wheezing.  Gastrointestinal: Denies dysphagia, heartburn, reflux, water brash, pain, cramps, nausea, vomiting, bloating, diarrhea, constipation, hematemesis, melena, hematochezia, jaundice or hemorrhoids Genitourinary: Denies dysuria, frequency, urgency, nocturia, hesitancy, discharge, hematuria or flank pain Musculoskeletal: Denies arthralgia, myalgia, stiffness, Jt. Swelling, pain, limp or strain/sprain. Denies Falls. Skin: Denies puritis, rash, hives, warts, acne, eczema or change in skin lesion Neuro: No weakness, tremor, incoordination, spasms, paresthesia or pain Psychiatric: Denies confusion, memory loss or sensory loss. Denies Depression. Endocrine: Denies change in weight, skin, hair change, nocturia, and paresthesia, diabetic polys, visual blurring or hyper / hypo glycemic episodes.  Heme/Lymph: No excessive bleeding, bruising or enlarged lymph nodes.  Physical Exam  BP 124/72   Pulse 63   Temp (!) 97.5 F (36.4 C)   Ht 6' 1.56" (1.868 m)   Wt 218 lb (98.9 kg)   SpO2 96%   BMI 28.33 kg/m   General Appearance: Well nourished and well groomed and in no apparent distress.  Eyes: PERRLA, EOMs, conjunctiva no swelling or erythema, normal fundi and vessels. Sinuses: No frontal/maxillary tenderness ENT/Mouth: EACs patent / TMs  nl. Nares clear without erythema, swelling, mucoid exudates. Oral hygiene is good. No erythema, swelling, or exudate. Tongue normal, non-obstructing. Tonsils not swollen or erythematous. Hearing normal.  Neck: Supple, thyroid not palpable. No bruits, nodes or JVD. Respiratory: Respiratory effort normal.  BS equal and clear bilateral without rales, rhonci, wheezing or stridor. Cardio: Heart sounds are normal with regular rate and rhythm and no murmurs, rubs or gallops. Peripheral pulses are normal and equal bilaterally without edema. No aortic or femoral bruits. Chest: symmetric with normal excursions and  percussion.  Abdomen: Soft, with Nl bowel sounds. Nontender, no guarding, rebound, hernias, masses, or organomegaly.  Lymphatics: Non tender without lymphadenopathy.  Musculoskeletal: Full ROM all peripheral extremities, joint stability, 5/5 strength, and normal gait. Skin: Warm and dry without rashes, lesions, cyanosis, clubbing or  ecchymosis.  Neuro: Cranial nerves intact, reflexes equal bilaterally. Normal muscle tone, no cerebellar symptoms. Sensation intact.  Pysch: Alert and oriented X 3 with normal affect, insight and judgment appropriate.   Assessment and Plan  1. Annual Preventative/Screening Exam    2. Elevated BP without diagnosis of hypertension  - EKG 12-Lead - Urinalysis, Routine w reflex microscopic - Microalbumin / creatinine urine ratio - CBC with Differential/Platelet - COMPLETE METABOLIC PANEL WITH GFR - Magnesium - TSH  3. Hyperlipidemia, mixed  - EKG 12-Lead - Lipid panel - TSH  4. Glucose intolerance  - EKG 12-Lead - Hemoglobin A1c - Insulin, random  5. Vitamin D deficiency  - VITAMIN D 25 Hydroxy  6. Screening examination for pulmonary tuberculosis  - TB Skin Test  7. Screening for colorectal cancer  - Ambulatory referral to Gastroenterology  8. Prostate cancer screening  - PSA  9. FHx: heart disease  - EKG 12-Lead  10. Former smoker  -  EKG 12-Lead  11. Fatigue  - Iron,Total/Total Iron Binding Cap - Vitamin B12 - Testosterone - CBC with Differential/Platelet - TSH  12. Medication management  - Urinalysis, Routine w reflex microscopic - Microalbumin / creatinine urine ratio  13. Screening for heart disease  - EKG 12-Lead        Patient is given Rx for Lexapro to try 6 weeks to see if helps with his anxiety and moodiness. Patient also given Rx for Pepcid for sx's of reflux & sore throat.                 Patient was counseled in prudent diet, weight control to achieve/maintain BMI less than 25, BP monitoring, regular  exercise and medications as discussed.  Discussed med effects and SE's. Routine screening labs and tests as requested with regular follow-up as recommended. Over 40 minutes of exam, counseling, chart review and high complex critical decision making was performed   Kirtland Bouchard, MD

## 2020-08-18 ENCOUNTER — Ambulatory Visit: Payer: BC Managed Care – PPO | Admitting: Internal Medicine

## 2020-08-18 ENCOUNTER — Other Ambulatory Visit: Payer: Self-pay

## 2020-08-18 ENCOUNTER — Encounter: Payer: Self-pay | Admitting: Internal Medicine

## 2020-08-18 VITALS — BP 124/72 | HR 63 | Temp 97.5°F | Ht 73.56 in | Wt 218.0 lb

## 2020-08-18 DIAGNOSIS — Z111 Encounter for screening for respiratory tuberculosis: Secondary | ICD-10-CM | POA: Diagnosis not present

## 2020-08-18 DIAGNOSIS — Z130 Encounter for screening for diseases of the blood and blood-forming organs and certain disorders involving the immune mechanism: Secondary | ICD-10-CM | POA: Diagnosis not present

## 2020-08-18 DIAGNOSIS — Z8249 Family history of ischemic heart disease and other diseases of the circulatory system: Secondary | ICD-10-CM

## 2020-08-18 DIAGNOSIS — Z1389 Encounter for screening for other disorder: Secondary | ICD-10-CM | POA: Diagnosis not present

## 2020-08-18 DIAGNOSIS — Z125 Encounter for screening for malignant neoplasm of prostate: Secondary | ICD-10-CM | POA: Diagnosis not present

## 2020-08-18 DIAGNOSIS — Z1211 Encounter for screening for malignant neoplasm of colon: Secondary | ICD-10-CM

## 2020-08-18 DIAGNOSIS — R5383 Other fatigue: Secondary | ICD-10-CM

## 2020-08-18 DIAGNOSIS — E559 Vitamin D deficiency, unspecified: Secondary | ICD-10-CM | POA: Diagnosis not present

## 2020-08-18 DIAGNOSIS — Z0000 Encounter for general adult medical examination without abnormal findings: Secondary | ICD-10-CM | POA: Diagnosis not present

## 2020-08-18 DIAGNOSIS — E7439 Other disorders of intestinal carbohydrate absorption: Secondary | ICD-10-CM

## 2020-08-18 DIAGNOSIS — Z1329 Encounter for screening for other suspected endocrine disorder: Secondary | ICD-10-CM

## 2020-08-18 DIAGNOSIS — R030 Elevated blood-pressure reading, without diagnosis of hypertension: Secondary | ICD-10-CM | POA: Diagnosis not present

## 2020-08-18 DIAGNOSIS — N401 Enlarged prostate with lower urinary tract symptoms: Secondary | ICD-10-CM

## 2020-08-18 DIAGNOSIS — K219 Gastro-esophageal reflux disease without esophagitis: Secondary | ICD-10-CM

## 2020-08-18 DIAGNOSIS — Z131 Encounter for screening for diabetes mellitus: Secondary | ICD-10-CM

## 2020-08-18 DIAGNOSIS — Z0001 Encounter for general adult medical examination with abnormal findings: Secondary | ICD-10-CM

## 2020-08-18 DIAGNOSIS — E782 Mixed hyperlipidemia: Secondary | ICD-10-CM

## 2020-08-18 DIAGNOSIS — Z79899 Other long term (current) drug therapy: Secondary | ICD-10-CM | POA: Diagnosis not present

## 2020-08-18 DIAGNOSIS — Z13220 Encounter for screening for lipoid disorders: Secondary | ICD-10-CM | POA: Diagnosis not present

## 2020-08-18 DIAGNOSIS — Z87891 Personal history of nicotine dependence: Secondary | ICD-10-CM

## 2020-08-18 DIAGNOSIS — F419 Anxiety disorder, unspecified: Secondary | ICD-10-CM

## 2020-08-18 DIAGNOSIS — R350 Frequency of micturition: Secondary | ICD-10-CM | POA: Diagnosis not present

## 2020-08-18 DIAGNOSIS — Z136 Encounter for screening for cardiovascular disorders: Secondary | ICD-10-CM

## 2020-08-18 MED ORDER — FAMOTIDINE 40 MG PO TABS: ORAL_TABLET | ORAL | 1 refills | Status: AC

## 2020-08-18 MED ORDER — ESCITALOPRAM OXALATE 20 MG PO TABS: ORAL_TABLET | ORAL | 3 refills | Status: DC

## 2020-08-19 DIAGNOSIS — C4441 Basal cell carcinoma of skin of scalp and neck: Secondary | ICD-10-CM | POA: Diagnosis not present

## 2020-08-19 DIAGNOSIS — C44612 Basal cell carcinoma of skin of right upper limb, including shoulder: Secondary | ICD-10-CM | POA: Diagnosis not present

## 2020-08-20 LAB — LIPID PANEL
Cholesterol: 188 mg/dL (ref <200)
HDL: 42 mg/dL (ref >=40)
LDL Cholesterol (Calc): 126 mg/dL (calc) — ABNORMAL HIGH
Non-HDL Cholesterol (Calc): 146 mg/dL (calc) — ABNORMAL HIGH (ref <130)
Total CHOL/HDL Ratio: 4.5 (calc) (ref <5.0)
Triglycerides: 95 mg/dL (ref <150)

## 2020-08-20 LAB — MICROALBUMIN / CREATININE URINE RATIO
Creatinine, Urine: 211 mg/dL (ref 20–320)
Microalb Creat Ratio: 2 mcg/mg creat (ref <30)
Microalb, Ur: 0.4 mg/dL

## 2020-08-20 LAB — CBC WITH DIFFERENTIAL/PLATELET
Absolute Monocytes: 540 cells/uL (ref 200–950)
Basophils Absolute: 72 cells/uL (ref 0–200)
Basophils Relative: 1.2 %
Eosinophils Absolute: 168 cells/uL (ref 15–500)
Eosinophils Relative: 2.8 %
HCT: 41.8 % (ref 38.5–50.0)
Hemoglobin: 14.5 g/dL (ref 13.2–17.1)
Lymphs Abs: 2046 cells/uL (ref 850–3900)
MCH: 30.9 pg (ref 27.0–33.0)
MCHC: 34.7 g/dL (ref 32.0–36.0)
MCV: 88.9 fL (ref 80.0–100.0)
MPV: 9.9 fL (ref 7.5–12.5)
Monocytes Relative: 9 %
Neutro Abs: 3174 cells/uL (ref 1500–7800)
Neutrophils Relative %: 52.9 %
Platelets: 251 10*3/uL (ref 140–400)
RBC: 4.7 10*6/uL (ref 4.20–5.80)
RDW: 12.2 % (ref 11.0–15.0)
Total Lymphocyte: 34.1 %
WBC: 6 10*3/uL (ref 3.8–10.8)

## 2020-08-20 LAB — COMPLETE METABOLIC PANEL WITH GFR
AG Ratio: 2.2 (calc) (ref 1.0–2.5)
ALT: 14 U/L (ref 9–46)
AST: 17 U/L (ref 10–40)
Albumin: 4.4 g/dL (ref 3.6–5.1)
Alkaline phosphatase (APISO): 49 U/L (ref 36–130)
BUN: 17 mg/dL (ref 7–25)
CO2: 30 mmol/L (ref 20–32)
Calcium: 9.2 mg/dL (ref 8.6–10.3)
Chloride: 104 mmol/L (ref 98–110)
Creat: 1.14 mg/dL (ref 0.60–1.35)
GFR, Est African American: 89 mL/min/{1.73_m2} (ref >=60)
GFR, Est Non African American: 77 mL/min/{1.73_m2} (ref >=60)
Globulin: 2 g/dL (calc) (ref 1.9–3.7)
Glucose, Bld: 78 mg/dL (ref 65–99)
Potassium: 4.1 mmol/L (ref 3.5–5.3)
Sodium: 139 mmol/L (ref 135–146)
Total Bilirubin: 1 mg/dL (ref 0.2–1.2)
Total Protein: 6.4 g/dL (ref 6.1–8.1)

## 2020-08-20 LAB — PSA: PSA: 0.46 ng/mL (ref <=4.0)

## 2020-08-20 LAB — VITAMIN B12: Vitamin B-12: 462 pg/mL (ref 200–1100)

## 2020-08-20 LAB — URINALYSIS, ROUTINE W REFLEX MICROSCOPIC
Bilirubin Urine: NEGATIVE
Glucose, UA: NEGATIVE
Hgb urine dipstick: NEGATIVE
Ketones, ur: NEGATIVE
Leukocytes,Ua: NEGATIVE
Nitrite: NEGATIVE
Protein, ur: NEGATIVE
Specific Gravity, Urine: 1.024 (ref 1.001–1.03)
pH: 6 (ref 5.0–8.0)

## 2020-08-20 LAB — HEMOGLOBIN A1C
Hgb A1c MFr Bld: 4.7 % of total Hgb (ref <5.7)
Mean Plasma Glucose: 88 (calc)
eAG (mmol/L): 4.9 (calc)

## 2020-08-20 LAB — MAGNESIUM: Magnesium: 2.1 mg/dL (ref 1.5–2.5)

## 2020-08-20 LAB — IRON, TOTAL/TOTAL IRON BINDING CAP
%SAT: 33 % (calc) (ref 20–48)
Iron: 115 ug/dL (ref 50–180)
TIBC: 352 mcg/dL (calc) (ref 250–425)

## 2020-08-20 LAB — TESTOSTERONE: Testosterone: 497 ng/dL (ref 250–827)

## 2020-08-20 LAB — VITAMIN D 25 HYDROXY (VIT D DEFICIENCY, FRACTURES): Vit D, 25-Hydroxy: 82 ng/mL (ref 30–100)

## 2020-08-20 LAB — TSH: TSH: 1.13 mIU/L (ref 0.40–4.50)

## 2020-08-20 LAB — INSULIN, RANDOM: Insulin: 9.5 u[IU]/mL

## 2020-08-21 ENCOUNTER — Other Ambulatory Visit: Payer: Self-pay | Admitting: Internal Medicine

## 2020-08-21 DIAGNOSIS — E782 Mixed hyperlipidemia: Secondary | ICD-10-CM

## 2020-08-21 NOTE — Progress Notes (Signed)
==========================================================  -   Iron Normal  ==========================================================  - Vitamin B12 = 462 - very low normal (Ideal or Goal is between 450 - 1,100)   & may be associated with Anemia, Fatigue, Peripheral Neuropathy,  Dementia & Depression  - Recommend take a sub-lingual form of Vitamin B12 that you dissolve under tongue (dose between 500 - 5,000 mcg) & take Daily  ==========================================================  - PSA - very Low - Great ! ==========================================================  - Testosterone - Normal ==========================================================  - Total Chol = 188 - Great . . . . . . . . . . .  - But Bad / Dangerous LDL Chol = 126 - way too high  (Ideal or Goal is less than 70 ! )  - Recommend low chol diet to try & avoid taking Chol meds   - Recommend low cholesterol diet   - Cholesterol only comes from animal sources  - ie. meat, dairy, egg yolks  - Eat all the vegetables you want.  - Avoid meat, especially red meat - Beef AND Pork .  - Avoid cheese & dairy - milk & ice cream.    - Cheese is the most concentrated form of trans-fats which  is the worst thing to clog up our arteries.   - Veggie cheese is OK which can be found in the fresh  produce section at [employer] or [employer] or [employer] ==========================================================  - Recommend call office to schedule a lab visit in about 3 months to  recheck Chol  ==========================================================  - A1c - Normal - Great - No Diabetes ==========================================================  - Vitamin D = 82 - Excellent ==========================================================

## 2020-08-24 ENCOUNTER — Encounter: Payer: Self-pay | Admitting: Internal Medicine

## 2020-10-02 DIAGNOSIS — J101 Influenza due to other identified influenza virus with other respiratory manifestations: Secondary | ICD-10-CM | POA: Diagnosis not present

## 2020-10-05 DIAGNOSIS — J45909 Unspecified asthma, uncomplicated: Secondary | ICD-10-CM | POA: Diagnosis not present

## 2020-10-05 DIAGNOSIS — J1089 Influenza due to other identified influenza virus with other manifestations: Secondary | ICD-10-CM | POA: Diagnosis not present

## 2020-10-05 DIAGNOSIS — Z1152 Encounter for screening for COVID-19: Secondary | ICD-10-CM | POA: Diagnosis not present

## 2020-10-05 DIAGNOSIS — R051 Acute cough: Secondary | ICD-10-CM | POA: Diagnosis not present

## 2020-10-18 ENCOUNTER — Encounter: Payer: 59 | Admitting: Internal Medicine

## 2020-11-11 ENCOUNTER — Encounter: Payer: Self-pay | Admitting: Adult Health

## 2020-11-11 ENCOUNTER — Other Ambulatory Visit: Payer: Self-pay

## 2020-11-11 ENCOUNTER — Ambulatory Visit: Payer: BC Managed Care – PPO | Admitting: Adult Health

## 2020-11-11 DIAGNOSIS — U071 COVID-19: Secondary | ICD-10-CM

## 2020-11-11 MED ORDER — PROMETHAZINE-DM 6.25-15 MG/5ML PO SYRP: 5.0000 mL | ORAL_SOLUTION | Freq: Four times a day (QID) | ORAL | 1 refills | Status: DC | PRN

## 2020-11-11 MED ORDER — DEXAMETHASONE 1 MG PO TABS: ORAL_TABLET | ORAL | 0 refills | Status: DC

## 2020-11-11 NOTE — Progress Notes (Signed)
THIS ENCOUNTER IS A VIRTUAL VISIT DUE TO COVID-19 - PATIENT WAS NOT SEEN IN THE OFFICE.  PATIENT HAS CONSENTED TO VIRTUAL VISIT / TELEMEDICINE VISIT   Virtual Visit via telephone Note  I connected with Bradley Roy on 11/11/2020 by telephone.  I verified that I am speaking with the correct person using two identifiers.    I discussed the limitations of evaluation and management by telemedicine and the availability of in person appointments. The patient expressed understanding and agreed to proceed.  History of Present Illness:  There were no vitals taken for this visit.  49 y.o. patient remote former smoker with hx of asthma and allergies contacted office due to URI sx suspect for covid 19. They were found to be positive on drive through rapid screen, OV was converted to telephone visit to minimize exposure in office. This patient is not vaccinated for covid 19.   Sx began 4 days days ago 11/08/2020 with headache, weakness, joints aching, mild fever (99.6), coughing (productive), sinus pressure, burning in chest, sinus drainage became much worse yesterday (fatigued and lay in bed, temp remains in 99 range, no temp today). Denies CP, dyspnea, wheezing, dizziness, vision changes. Denies GI sx, rash.    He has been taking mucinex and vitamins (vit C, D, zinc). No taking cough med, wanted to make sure would cough up secretions. Has albuterol, hasn't needed.   Exposures: 3 family members with onset similar time frame were at home over Christmas   Vaccination: none   Medications    Current Outpatient Medications (Respiratory):  .  albuterol (PROVENTIL HFA;VENTOLIN HFA) 108 (90 Base) MCG/ACT inhaler, Use 1 to 2 inhalations 15 minutes apart every 4 hours as needed to rescue Asthma .  azelastine (ASTELIN) 0.1 % nasal spray, USE 1-2 SPRAYS IN EACH NOSTRIL TWICE A DAY .  fexofenadine (ALLEGRA) 30 MG tablet, Take 30 mg by mouth 2 (two) times daily. .  fluticasone (FLONASE) 50 MCG/ACT  nasal spray, Place 1 spray into both nostrils 2 (two) times daily.    Current Outpatient Medications (Other):  .  escitalopram (LEXAPRO) 20 MG tablet, Take    1 tablet    Daily     for Mood .  famotidine (PEPCID) 40 MG tablet, Take     1 tablet    Daily      for Heart burn & Acid Reflux  Allergies:  Allergies  Allergen Reactions  . Codeine     Headache    Problem list He has Asthma; Vitamin D deficiency; Medication management; Elevated BP; Other abnormal glucose; and Hyperlipidemia on their problem list.   Social History:   reports that he quit smoking about 27 years ago. His smokeless tobacco use includes snuff. He reports current alcohol use. He reports that he does not use drugs.   Observations/Objective:  General : Fairly well sounding patient in no acute distress HEENT: no hoarseness, no cough for duration of visit, mildly nasal vocal quality Lungs: speaks in complete sentences, no audible wheezing, no apparent distress Neurological: alert, oriented x 3 Psychiatric: pleasant, judgement appropriate   Assessment and Plan:  COVID-19  Covid 19 positive per rapid screening test in unvaccinated  Currently symptoms are mild to moderate Risk factors include unvaccinated status, hx of asthma Due to  risk factors, discussed monoclonal antibody - patient declines referral at this time Regular breathing exercises, proning EC bASA daily for clot prevention unless contraindicated, regular walking/calf exercises Take tylenol PRN temp 101+ Push hydration Sx supportive therapy  Steroid taper was offered and decadron taper sent in  Immune support with vitamin C, zinc, vitamin D reviewed Follow up via mychart or telephone if needed Advised patient obtain O2 monitor; present to ED if persistently <88% or with severe dyspnea, any CP, fever uncontrolled by tylenol, confusion, sudden decline Should remain in isolation until at least 10 days from onset of sx, 24-48 hours fever free without  tylenol, sx such as cough are improved.    Follow Up Instructions:  I discussed the assessment and treatment plan with the patient. The patient was provided an opportunity to ask questions and all were answered. The patient agreed with the plan and demonstrated an understanding of the instructions.   The patient was advised to call back or seek an in-person evaluation if the symptoms worsen or if the condition fails to improve as anticipated.  I provided 20 minutes of non-face-to-face time during this encounter.   Dan Maker, NP

## 2020-11-24 ENCOUNTER — Ambulatory Visit (AMBULATORY_SURGERY_CENTER): Payer: Self-pay

## 2020-11-24 ENCOUNTER — Other Ambulatory Visit: Payer: Self-pay

## 2020-11-24 VITALS — Ht 73.0 in | Wt 226.0 lb

## 2020-11-24 DIAGNOSIS — Z86010 Personal history of colonic polyps: Secondary | ICD-10-CM

## 2020-11-24 MED ORDER — SUTAB 1479-225-188 MG PO TABS: 1.0000 | ORAL_TABLET | Freq: Once | ORAL | 0 refills | Status: AC

## 2020-11-24 NOTE — Progress Notes (Signed)
Patient is here in-person for PV. Patient denies any allergies to eggs or soy. Patient denies any problems with anesthesia/sedation. Patient denies any oxygen use at home. Patient denies taking any diet/weight loss medications or blood thinners. Patient is not being treated for MRSA or C-diff. Patient is aware of our care-partner policy and [id number]-78 safety protocol. EMMI education assigned to the patient for the procedure, sent to [facility].   +Covid in 10/2020-no Covid test ordered  Prep Prescription coupon given to the patient.

## 2020-11-25 IMAGING — CR DG CHEST 2V
2 series · 2 of 2 positions shown · non-contrast
Comparison: None.

CLINICAL DATA: Chest pain.

EXAM:
CHEST - 2 VIEW

[chest pa]
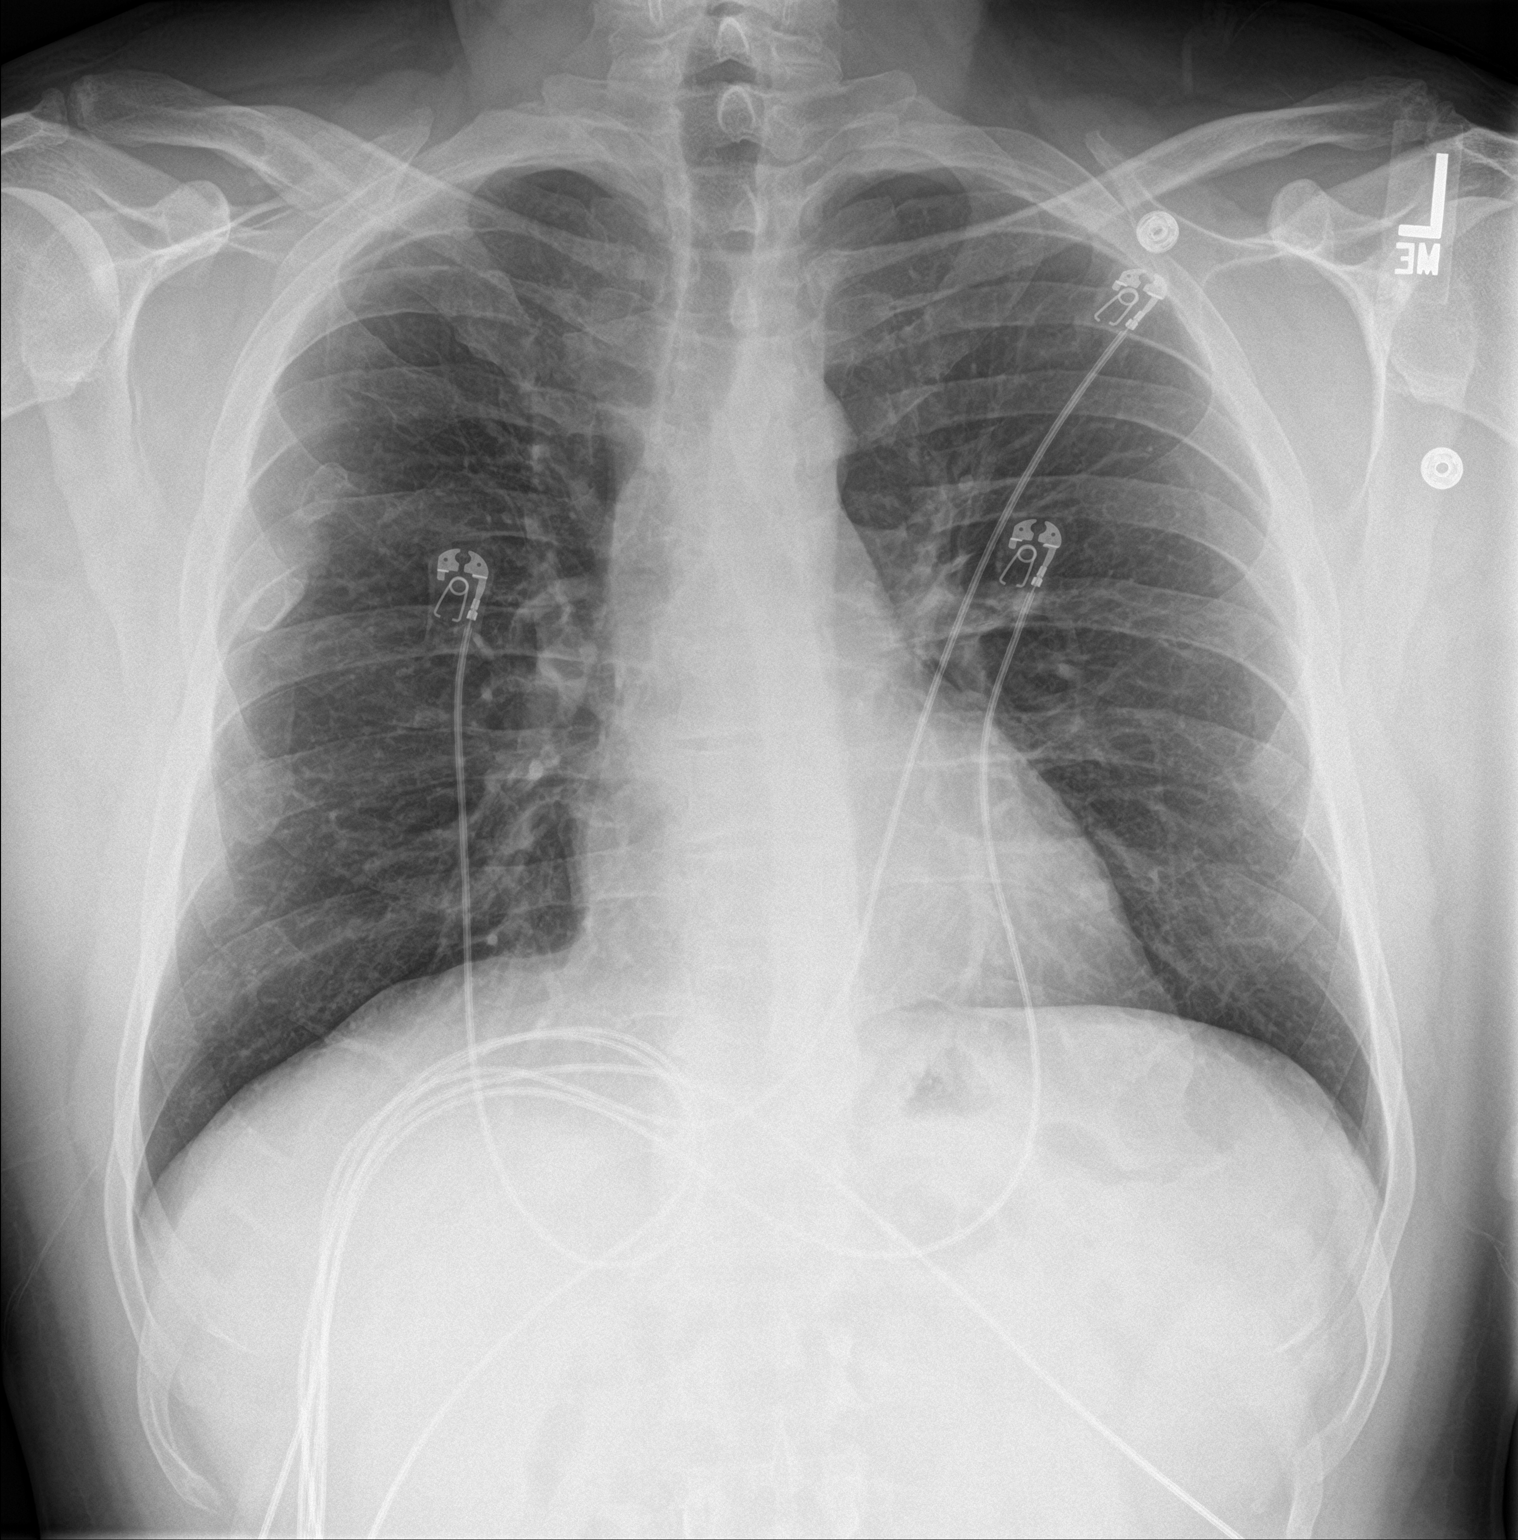

[chest lat]
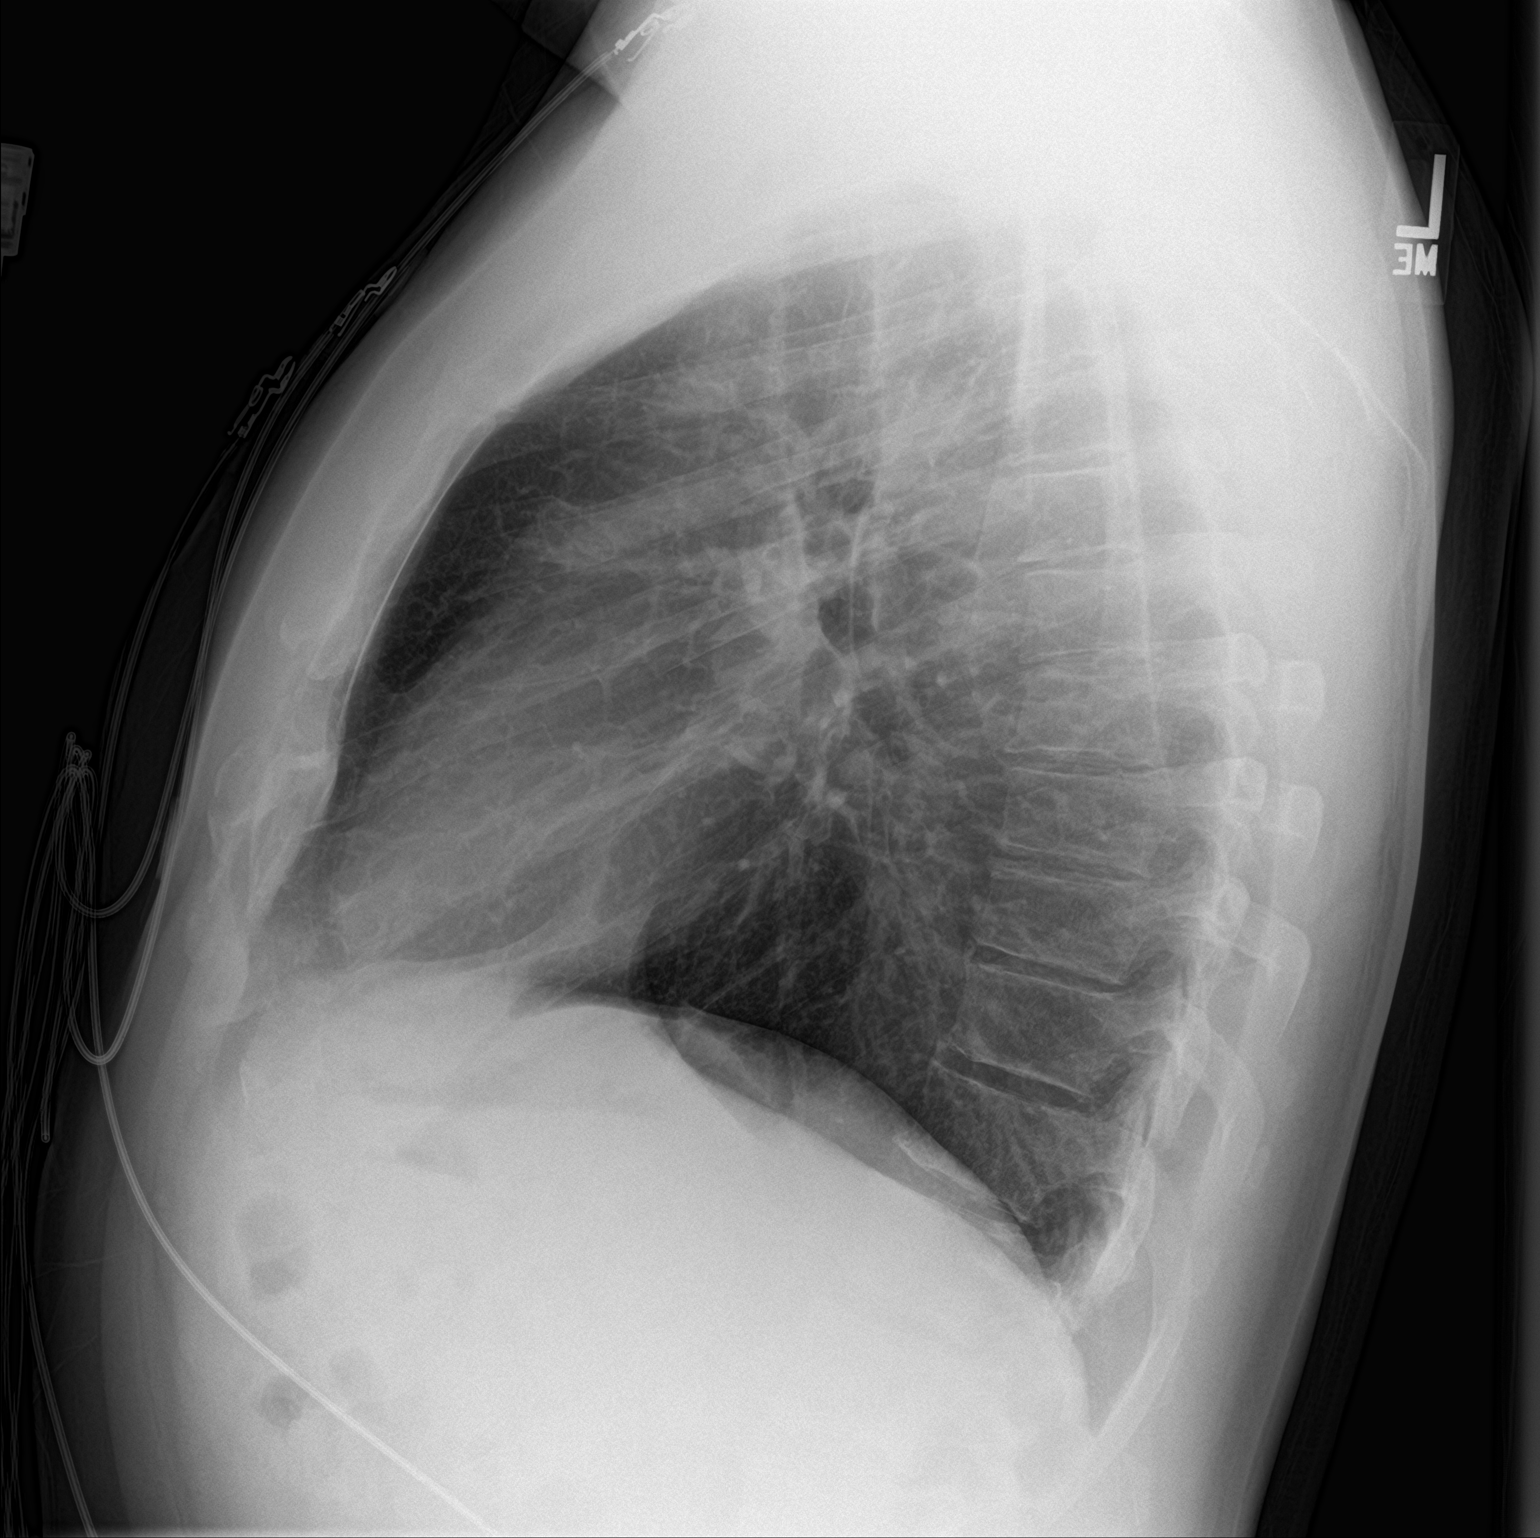

[2 of 2 positions shown; findings below may reference images not displayed]

FINDINGS: Lungs are adequately inflated without consolidation or effusion.
Cardiomediastinal silhouette is normal. Old right anterior rib
fractures. Mild degenerative change of the spine.
IMPRESSION: No active cardiopulmonary disease.

## 2020-12-02 ENCOUNTER — Encounter: Payer: Self-pay | Admitting: Internal Medicine

## 2020-12-08 ENCOUNTER — Encounter: Payer: Self-pay | Admitting: Internal Medicine

## 2020-12-08 ENCOUNTER — Other Ambulatory Visit: Payer: Self-pay

## 2020-12-08 ENCOUNTER — Ambulatory Visit (AMBULATORY_SURGERY_CENTER): Payer: BC Managed Care – PPO | Admitting: Internal Medicine

## 2020-12-08 VITALS — BP 101/59 | HR 56 | Temp 97.6°F | Resp 16 | Ht 73.0 in | Wt 226.0 lb

## 2020-12-08 DIAGNOSIS — D123 Benign neoplasm of transverse colon: Secondary | ICD-10-CM

## 2020-12-08 DIAGNOSIS — Z86010 Personal history of colonic polyps: Secondary | ICD-10-CM | POA: Diagnosis not present

## 2020-12-08 DIAGNOSIS — Z1211 Encounter for screening for malignant neoplasm of colon: Secondary | ICD-10-CM | POA: Diagnosis not present

## 2020-12-08 MED ORDER — SODIUM CHLORIDE 0.9 % IV SOLN: 500.0000 mL | INTRAVENOUS | Status: DC

## 2020-12-08 NOTE — Progress Notes (Signed)
Called to room to assist during endoscopic procedure.  Patient ID and intended procedure confirmed with present staff. Received instructions for my participation in the procedure from the performing physician.  

## 2020-12-08 NOTE — Progress Notes (Signed)
Pt's states no medical or surgical changes since previsit or office visit.  Vs in sdm by CW

## 2020-12-08 NOTE — Op Note (Signed)
[facility] Patient Name: Bradley Roy Procedure Date: 12/08/2020 9:36 AM MRN: [id number] Endoscopist: Beverley Fiedler , MD Age: 50 Referring MD:  Date of Birth: 05-26-1971 Gender: Male Account #: [id number] Procedure:                Colonoscopy Indications:              High risk colon cancer surveillance: Personal                            history of multiple (5) adenomas, Last colonoscopy:                            June 2018 Medicines:                Monitored Anesthesia Care Procedure:                Pre-Anesthesia Assessment:                           - Prior to the procedure, a History and Physical                            was performed, and patient medications and                            allergies were reviewed. The patient's tolerance of                            previous anesthesia was also reviewed. The risks                            and benefits of the procedure and the sedation                            options and risks were discussed with the patient.                            All questions were answered, and informed consent                            was obtained. Prior Anticoagulants: The patient has                            taken no previous anticoagulant or antiplatelet                            agents. ASA Grade Assessment: II - A patient with                            mild systemic disease. After reviewing the risks                            and benefits, the patient was deemed in  satisfactory condition to undergo the procedure.                           After obtaining informed consent, the colonoscope                            was passed under direct vision. Throughout the                            procedure, the patient's blood pressure, pulse, and                            oxygen saturations were monitored continuously. The                            Colonoscope was introduced through the anus and                             advanced to the cecum, identified by palpation. The                            colonoscopy was performed without difficulty. The                            patient tolerated the procedure well. The quality                            of the bowel preparation was good (SuTab converted                            to MiraLax due to vomiting). The ileocecal valve,                            appendiceal orifice, and rectum were photographed. Scope In: 9:50:49 AM Scope Out: 10:03:13 AM Scope Withdrawal Time: 0 hours 9 minutes 29 seconds  Total Procedure Duration: 0 hours 12 minutes 24 seconds  Findings:                 The digital rectal exam was normal.                           A 5 mm polyp was found in the transverse colon. The                            polyp was sessile. The polyp was removed with a                            cold snare. Resection and retrieval were complete.                           The exam was otherwise without abnormality on                            direct and retroflexion  views. Complications:            No immediate complications. Estimated Blood Loss:     Estimated blood loss was minimal. Impression:               - One 5 mm polyp in the transverse colon, removed                            with a cold snare. Resected and retrieved.                           - The examination was otherwise normal on direct                            and retroflexion views. Recommendation:           - Patient has a contact number available for                            emergencies. The signs and symptoms of potential                            delayed complications were discussed with the                            patient. Return to normal activities tomorrow.                            Written discharge instructions were provided to the                            patient.                           - Resume previous diet.                           -  Continue present medications.                           - Await pathology results.                           - Repeat colonoscopy is recommended for                            surveillance (likely in 5 years). The colonoscopy                            date will be determined after pathology results                            from today's exam become available for review. Jerene Bears, MD 12/08/2020 10:05:53 AM This report has been signed electronically.

## 2020-12-08 NOTE — Progress Notes (Signed)
ST segment normal in recovery.  Sinus bradycardia noted.

## 2020-12-08 NOTE — Progress Notes (Signed)
Report to PACU, RN, vss, BBS= Clear.  Pt alert and talking reports no chest pain says he feels great.  ST segment not slanted anymore on Recovery monitor.  PACU Rn made aware

## 2020-12-08 NOTE — Progress Notes (Signed)
At cecum in lead II, pt EKG changed to show an upsloping ST segment, one BP was elevated and then normalized without intervention (all 200mg  of propofol had been given).  HR stayed 55-65.  EKG stayed as it was from cecum to finish and BPs were congruent with being sedated.  No sedation was given after EKG changes noted.  Pt allowed to wake up in procedure room and reported no symptoms

## 2020-12-08 NOTE — Patient Instructions (Signed)
Read all of the handouts given to you your recovery room nurse.  You will need another colonoscopy in 5 years.  YOU HAD AN ENDOSCOPIC PROCEDURE TODAY AT [facility]:   Refer to the procedure report that was given to you for any specific questions about what was found during the examination.  If the procedure report does not answer your questions, please call your gastroenterologist to clarify.  If you requested that your care partner not be given the details of your procedure findings, then the procedure report has been included in a sealed envelope for you to review at your convenience later.  YOU SHOULD EXPECT: Some feelings of bloating in the abdomen. Passage of more gas than usual.  Walking can help get rid of the air that was put into your GI tract during the procedure and reduce the bloating. If you had a lower endoscopy (such as a colonoscopy or flexible sigmoidoscopy) you may notice spotting of blood in your stool or on the toilet paper. If you underwent a bowel prep for your procedure, you may not have a normal bowel movement for a few days.  Please Note:  You might notice some irritation and congestion in your nose or some drainage.  This is from the oxygen used during your procedure.  There is no need for concern and it should clear up in a day or so.  SYMPTOMS TO REPORT IMMEDIATELY:   Following lower endoscopy (colonoscopy or flexible sigmoidoscopy):  Excessive amounts of blood in the stool  Significant tenderness or worsening of abdominal pains  Swelling of the abdomen that is new, acute  Fever of 100F or higher    For urgent or emergent issues, a gastroenterologist can be reached at any hour by calling [phone]. Do not use MyChart messaging for urgent concerns.    DIET:  We do recommend a small meal at first, but then you may proceed to your regular diet.  Drink plenty of fluids but you should avoid alcoholic beverages for 24 hours.  ACTIVITY:  You  should plan to take it easy for the rest of today and you should NOT DRIVE or use heavy machinery until tomorrow (because of the sedation medicines used during the test).    FOLLOW UP: Our staff will call the number listed on your records 48-72 hours following your procedure to check on you and address any questions or concerns that you may have regarding the information given to you following your procedure. If we do not reach you, we will leave a message.  We will attempt to reach you two times.  During this call, we will ask if you have developed any symptoms of COVID 19. If you develop any symptoms (ie: fever, flu-like symptoms, shortness of breath, cough etc.) before then, please call [phone].  If you test positive for Covid 19 in the 2 weeks post procedure, please call and report this information to [place].    If any biopsies were taken you will be contacted by phone or by letter within the next 1-3 weeks.  Please call us at [phone] if you have not heard about the biopsies in 3 weeks.    SIGNATURES/CONFIDENTIALITY: You and/or your care partner have signed paperwork which will be entered into your electronic medical record.  These signatures attest to the fact that that the information above on your After Visit Summary has been reviewed and is understood.  Full responsibility of the confidentiality of this discharge information lies  with you and/or your care-partner.

## 2020-12-10 ENCOUNTER — Telehealth: Payer: Self-pay

## 2020-12-10 NOTE — Telephone Encounter (Signed)
Covid-19 screening questions   Do you now or have you had a fever in the last 14 days? No.  Do you have any respiratory symptoms of shortness of breath or cough now or in the last 14 days? No.  Do you have any family members or close contacts with diagnosed or suspected Covid-19 in the past 14 days? No.  Have you been tested for Covid-19 and found to be positive? No.       Follow up Call-  Call back number 12/08/2020  Post procedure Call Back phone  # [phone]  Permission to leave phone message Yes  Some recent data might be hidden     Patient questions:  Do you have a fever, pain , or abdominal swelling? No. Pain Score  0 *  Have you tolerated food without any problems? Yes.    Have you been able to return to your normal activities? Yes.    Do you have any questions about your discharge instructions: Diet   No. Medications  No. Follow up visit  No.  Do you have questions or concerns about your Care? No.  Actions: * If pain score is 4 or above: No action needed, pain <4.

## 2020-12-14 ENCOUNTER — Encounter: Payer: Self-pay | Admitting: Internal Medicine

## 2021-08-23 ENCOUNTER — Encounter: Payer: Self-pay | Admitting: Internal Medicine

## 2021-09-22 NOTE — Progress Notes (Deleted)
Complete Physical  Assessment and Plan:  Discussed med's effects and SE's. Screening labs and tests as requested with regular follow-up as recommended. Over 40 minutes of exam, counseling, chart review and critical decision making was performed  HPI Patient presents for a complete physical.   His blood pressure has been controlled at home, today their BP is   BP Readings from Last 3 Encounters:  12/08/20 (!) 101/59  08/18/20 124/72  08/11/19 107/78    He {DOES_DOES [id number]} workout. He denies chest pain, shortness of breath, dizziness.  He is not on cholesterol medication and denies myalgias. His cholesterol is not at goal. The cholesterol last visit was:   Lab Results  Component Value Date   CHOL 188 08/18/2020   HDL 42 08/18/2020   LDLCALC 126 (H) 08/18/2020   TRIG 95 08/18/2020   CHOLHDL 4.5 08/18/2020    Last A1C in the office was:  Lab Results  Component Value Date   HGBA1C 4.7 08/18/2020   Last GFR:   Lab Results  Component Value Date   GFRNONAA 77 08/18/2020     Patient is on Vitamin D supplement.   Lab Results  Component Value Date   VD25OH 82 08/18/2020     Last PSA was: Lab Results  Component Value Date   PSA 0.46 08/18/2020    Current Medications:  Current Outpatient Medications on File Prior to Visit  Medication Sig Dispense Refill   albuterol (PROVENTIL HFA;VENTOLIN HFA) 108 (90 Base) MCG/ACT inhaler Use 1 to 2 inhalations 15 minutes apart every 4 hours as needed to rescue Asthma (Patient not taking: No sig reported) 48 g 3   azelastine (ASTELIN) 0.1 % nasal spray USE 1-2 SPRAYS IN EACH NOSTRIL TWICE A DAY (Patient not taking: No sig reported) 90 mL 3   famotidine (PEPCID) 40 MG tablet Take     1 tablet    Daily      for Heart burn & Acid Reflux 90 tablet 1   fexofenadine (ALLEGRA) 30 MG tablet Take 30 mg by mouth 2 (two) times daily. (Patient not taking: No sig reported)     fluticasone (FLONASE) 50 MCG/ACT nasal spray Place 1 spray into both  nostrils 2 (two) times daily. (Patient not taking: No sig reported) 16 g 6   No current facility-administered medications on file prior to visit.   Allergies:  Allergies  Allergen Reactions   Codeine     Headache   Health Maintenance:  Immunization History  Administered Date(s) Administered   PPD Test 05/04/2014, 05/05/2015, 05/04/2016, 06/27/2018, 07/17/2019, 08/18/2020   Tdap 05/04/2014, 06/04/2016    Tetanus: 2017 Pneumovax: N/A Prevnar 13: N/A Flu vaccine: Zostavax: N/A  DEXA: Colonoscopy: 12/08/20 due 2027 EGD: Eye Exam: Dentist:  Patient Care Team: Unk Pinto, MD as PCP - General (Internal Medicine)  Medical History:  has Asthma; Vitamin D deficiency; Medication management; Elevated BP; Other abnormal glucose; Hyperlipidemia; and COVID-19 (11/11/2020) on their problem list. Surgical History:  He  has a past surgical history that includes Wrist surgery (Left); Knee arthroscopy (Right, 2011); Finger surgery (Right); and Colonoscopy. Family History:  His family history includes Colon cancer in his paternal grandfather; Heart disease in his maternal grandfather; Prostate cancer in his father. Social History:   reports that he has quit smoking. His smokeless tobacco use includes snuff. He reports that he does not currently use alcohol. He reports that he does not use drugs. Review of Systems:  ROS  Physical Exam: Estimated body mass index is 29.82  kg/m as calculated from the following:   Height as of 12/08/20: 6\' 1"  (1.854 m).   Weight as of 12/08/20: 226 lb (102.5 kg). There were no vitals taken for this visit. General Appearance: Well nourished, in no apparent distress.  Eyes: PERRLA, EOMs, conjunctiva no swelling or erythema, normal fundi and vessels.  Sinuses: No Frontal/maxillary tenderness  ENT/Mouth: Ext aud canals clear, normal light reflex with TMs without erythema, bulging. Good dentition. No erythema, swelling, or exudate on post pharynx. Tonsils not  swollen or erythematous. Hearing normal.  Neck: Supple, thyroid normal. No bruits  Respiratory: Respiratory effort normal, BS equal bilaterally without rales, rhonchi, wheezing or stridor.  Cardio: RRR without murmurs, rubs or gallops. Brisk peripheral pulses without edema.  Chest: symmetric, with normal excursions and percussion.  Abdomen: Soft, nontender, no guarding, rebound, hernias, masses, or organomegaly.  Lymphatics: Non tender without lymphadenopathy.  Genitourinary:  Musculoskeletal: Full ROM all peripheral extremities,5/5 strength, and normal gait.  Skin: Warm, dry without rashes, lesions, ecchymosis. Neuro: Cranial nerves intact, reflexes equal bilaterally. Normal muscle tone, no cerebellar symptoms. Sensation intact.  Psych: Awake and oriented X 3, normal affect, Insight and Judgment appropriate.   EKG: WNL no changes. AORTA SCAN: WNL  Lynnex Fulp W Finnian Husted 9:50 AM [facility]

## 2021-09-27 ENCOUNTER — Encounter: Payer: BC Managed Care – PPO | Admitting: Nurse Practitioner

## 2021-09-27 DIAGNOSIS — Z0001 Encounter for general adult medical examination with abnormal findings: Secondary | ICD-10-CM

## 2021-09-27 DIAGNOSIS — R030 Elevated blood-pressure reading, without diagnosis of hypertension: Secondary | ICD-10-CM

## 2021-09-27 DIAGNOSIS — Z87891 Personal history of nicotine dependence: Secondary | ICD-10-CM

## 2021-09-27 DIAGNOSIS — E782 Mixed hyperlipidemia: Secondary | ICD-10-CM

## 2021-09-27 DIAGNOSIS — Z136 Encounter for screening for cardiovascular disorders: Secondary | ICD-10-CM

## 2021-09-27 DIAGNOSIS — Z1389 Encounter for screening for other disorder: Secondary | ICD-10-CM

## 2021-09-27 DIAGNOSIS — E7439 Other disorders of intestinal carbohydrate absorption: Secondary | ICD-10-CM

## 2021-09-27 DIAGNOSIS — E559 Vitamin D deficiency, unspecified: Secondary | ICD-10-CM

## 2021-09-27 DIAGNOSIS — Z79899 Other long term (current) drug therapy: Secondary | ICD-10-CM

## 2021-12-02 NOTE — Progress Notes (Signed)
Complete Physical  Assessment and Plan:  Bradley Roy was seen today for annual exam.  Diagnoses and all orders for this visit:  Encounter for general adult medical examination with abnormal findings Due Yearly  Elevated BP without diagnosis of hypertension - continue DASH diet, exercise and monitor at home. Call if greater than 130/80.   -     CBC with Differential/Platelet  Hyperlipidemia, mixed Continue diet and exercise -     COMPLETE METABOLIC PANEL WITH GFR -     Lipid panel  Glucose intolerance Continue diet and exercise -     Hemoglobin A1c  Vitamin D deficiency Currently not taking Vit D supplement -     VITAMIN D 25 Hydroxy (Vit-D Deficiency, Fractures)   Medication management -     Magnesium  Screening for hematuria or proteinuria -     Urinalysis, Routine w reflex microscopic -     Microalbumin / creatinine urine ratio  Screening for ischemic heart disease -     EKG 12-Lead  Screening for thyroid disorder  TSH  Screening for prostate cancer -     PSA  Plantar fasciitis of left foot -     predniSONE (DELTASONE) 20 MG tablet; 3 tablets daily with food for 3 days, 2 tabs daily for 3 days, 1 tab a day for 5 days. Given exercises, advised to avoid walking barefoot If worsens notify the office     Discussed med's effects and SE's. Screening labs and tests as requested with regular follow-up as recommended. Over 40 minutes of exam, counseling, chart review and critical decision making was performed  HPI Patient presents for a complete physical.   His blood pressure has been controlled at home, today their BP is BP: 114/70 BP Readings from Last 3 Encounters:  12/06/21 114/70  12/08/20 (!) 101/59  08/18/20 124/72    He does workout. He denies chest pain, shortness of breath, dizziness.   BMI is Body mass index is 28.65 kg/m., he has been working on diet and exercise. 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 Knows he needs to increase  exercise Wt Readings from Last 3 Encounters:  12/06/21 214 lb 3.2 oz (97.2 kg)  12/08/20 226 lb (102.5 kg)  11/24/20 226 lb (102.5 kg)   Pain on bottom of left foot, worse when barefoot. Pain occurs in the arch.  Has been ongoing x 3 months.  Describes the pain as sharp, shooting.  No relief with NSAIDs  He is not on cholesterol medication and denies myalgias. His cholesterol is not at goal. The cholesterol last visit was:   Lab Results  Component Value Date   CHOL 188 08/18/2020   HDL 42 08/18/2020   LDLCALC 126 (H) 08/18/2020   TRIG 95 08/18/2020   CHOLHDL 4.5 08/18/2020    Last A1C in the office was:  Lab Results  Component Value Date   HGBA1C 4.7 08/18/2020   Last GFR:   Lab Results  Component Value Date   GFRNONAA 77 08/18/2020     Lab Results  Component Value Date   GFRAA 89 08/18/2020   Patient is on Vitamin D supplement.   Lab Results  Component Value Date   VD25OH 82 08/18/2020     Last PSA was: Lab Results  Component Value Date   PSA 0.46 08/18/2020    Current Medications:  Current Outpatient Medications on File Prior to Visit  Medication Sig Dispense Refill   famotidine (PEPCID) 40 MG tablet Take  1 tablet    Daily      for Heart burn & Acid Reflux 90 tablet 1   albuterol (PROVENTIL HFA;VENTOLIN HFA) 108 (90 Base) MCG/ACT inhaler Use 1 to 2 inhalations 15 minutes apart every 4 hours as needed to rescue Asthma (Patient not taking: No sig reported) 48 g 3   azelastine (ASTELIN) 0.1 % nasal spray USE 1-2 SPRAYS IN EACH NOSTRIL TWICE A DAY (Patient not taking: No sig reported) 90 mL 3   fexofenadine (ALLEGRA) 30 MG tablet Take 30 mg by mouth 2 (two) times daily. (Patient not taking: No sig reported)     fluticasone (FLONASE) 50 MCG/ACT nasal spray Place 1 spray into both nostrils 2 (two) times daily. (Patient not taking: No sig reported) 16 g 6   No current facility-administered medications on file prior to visit.   Allergies:  Allergies  Allergen  Reactions   Codeine     Headache   Health Maintenance:  Immunization History  Administered Date(s) Administered   PPD Test 05/04/2014, 05/05/2015, 05/04/2016, 06/27/2018, 07/17/2019, 08/18/2020   Tdap 05/04/2014, 06/04/2016    TDAP: 2017  Patient Care Team: Unk Pinto, MD as PCP - General (Internal Medicine)  Medical History:  has Asthma; Vitamin D deficiency; Medication management; Elevated BP; Other abnormal glucose; Hyperlipidemia; and COVID-19 (11/11/2020) on their problem list. Surgical History:  He  has a past surgical history that includes Wrist surgery (Left); Knee arthroscopy (Right, 2011); Finger surgery (Right); and Colonoscopy. Family History:  His family history includes Colon cancer in his paternal grandfather; Heart disease in his maternal grandfather; Prostate cancer in his father. Social History:   reports that he has quit smoking. His smokeless tobacco use includes snuff. He reports that he does not currently use alcohol. He reports that he does not use drugs. Review of Systems:  ROS  Physical Exam: Estimated body mass index is 28.65 kg/m as calculated from the following:   Height as of this encounter: 6' 0.5" (1.842 m).   Weight as of this encounter: 214 lb 3.2 oz (97.2 kg). BP 114/70    Pulse 92    Temp 97.7 F (36.5 C)    Ht 6' 0.5" (1.842 m)    Wt 214 lb 3.2 oz (97.2 kg)    SpO2 96%    BMI 28.65 kg/m  General Appearance: Well nourished, in no apparent distress.  Eyes: PERRLA, EOMs, conjunctiva no swelling or erythema, normal fundi and vessels.  Sinuses: No Frontal/maxillary tenderness  ENT/Mouth: Ext aud canals clear, normal light reflex with TMs without erythema, bulging. Good dentition. No erythema, swelling, or exudate on post pharynx. Tonsils not swollen or erythematous. Hearing normal.  Neck: Supple, thyroid normal. No bruits  Respiratory: Respiratory effort normal, BS equal bilaterally without rales, rhonchi, wheezing or stridor.  Cardio: RRR  without murmurs, rubs or gallops. Brisk peripheral pulses without edema.  Chest: symmetric, with normal excursions and percussion.  Abdomen: Soft, nontender, no guarding, rebound, hernias, masses, or organomegaly.  Lymphatics: Non tender without lymphadenopathy.  Genitourinary: Deferred Musculoskeletal: Full ROM all peripheral extremities,5/5 strength, and normal gait. Pain on palpation of left sole in arch. No edema ,erythema or ecchymosis Skin: Warm, dry without rashes, lesions, ecchymosis. Neuro: Cranial nerves intact, reflexes equal bilaterally. Normal muscle tone, no cerebellar symptoms. Sensation intact.  Psych: Awake and oriented X 3, normal affect, Insight and Judgment appropriate.   EKG: WNL no changes.   Bradley Roy 3:24 PM [facility]

## 2021-12-06 ENCOUNTER — Other Ambulatory Visit: Payer: Self-pay

## 2021-12-06 ENCOUNTER — Encounter: Payer: Self-pay | Admitting: Nurse Practitioner

## 2021-12-06 ENCOUNTER — Ambulatory Visit (INDEPENDENT_AMBULATORY_CARE_PROVIDER_SITE_OTHER): Payer: BC Managed Care – PPO | Admitting: Nurse Practitioner

## 2021-12-06 VITALS — BP 114/70 | HR 92 | Temp 97.7°F | Ht 72.5 in | Wt 214.2 lb

## 2021-12-06 DIAGNOSIS — N401 Enlarged prostate with lower urinary tract symptoms: Secondary | ICD-10-CM | POA: Diagnosis not present

## 2021-12-06 DIAGNOSIS — Z0000 Encounter for general adult medical examination without abnormal findings: Secondary | ICD-10-CM

## 2021-12-06 DIAGNOSIS — Z79899 Other long term (current) drug therapy: Secondary | ICD-10-CM | POA: Diagnosis not present

## 2021-12-06 DIAGNOSIS — Z1389 Encounter for screening for other disorder: Secondary | ICD-10-CM | POA: Diagnosis not present

## 2021-12-06 DIAGNOSIS — Z1329 Encounter for screening for other suspected endocrine disorder: Secondary | ICD-10-CM

## 2021-12-06 DIAGNOSIS — Z136 Encounter for screening for cardiovascular disorders: Secondary | ICD-10-CM | POA: Diagnosis not present

## 2021-12-06 DIAGNOSIS — Z131 Encounter for screening for diabetes mellitus: Secondary | ICD-10-CM | POA: Diagnosis not present

## 2021-12-06 DIAGNOSIS — R030 Elevated blood-pressure reading, without diagnosis of hypertension: Secondary | ICD-10-CM | POA: Diagnosis not present

## 2021-12-06 DIAGNOSIS — Z13220 Encounter for screening for lipoid disorders: Secondary | ICD-10-CM

## 2021-12-06 DIAGNOSIS — F419 Anxiety disorder, unspecified: Secondary | ICD-10-CM

## 2021-12-06 DIAGNOSIS — E559 Vitamin D deficiency, unspecified: Secondary | ICD-10-CM

## 2021-12-06 DIAGNOSIS — R350 Frequency of micturition: Secondary | ICD-10-CM

## 2021-12-06 DIAGNOSIS — Z125 Encounter for screening for malignant neoplasm of prostate: Secondary | ICD-10-CM

## 2021-12-06 DIAGNOSIS — M722 Plantar fascial fibromatosis: Secondary | ICD-10-CM

## 2021-12-06 DIAGNOSIS — E782 Mixed hyperlipidemia: Secondary | ICD-10-CM

## 2021-12-06 DIAGNOSIS — Z0001 Encounter for general adult medical examination with abnormal findings: Secondary | ICD-10-CM

## 2021-12-06 DIAGNOSIS — E7439 Other disorders of intestinal carbohydrate absorption: Secondary | ICD-10-CM

## 2021-12-06 MED ORDER — PREDNISONE 20 MG PO TABS: ORAL_TABLET | ORAL | 0 refills | Status: AC

## 2021-12-06 NOTE — Patient Instructions (Signed)
Plantar Fasciitis Rehab Ask your health care provider which exercises are safe for you. Do exercises exactly as told by your health care provider and adjust them as directed. It is normal to feel mild stretching, pulling, tightness, or discomfort as you do these exercises. Stop right away if you feel sudden pain or your pain gets worse. Do not begin these exercises until told by your health care provider. Stretching and range-of-motion exercises These exercises warm up your muscles and joints and improve the movement and flexibility of your foot. These exercises also help to relieve pain. Plantar fascia stretch  Sit with your left / right leg crossed over your opposite knee. Hold your heel with one hand with that thumb near your arch. With your other hand, hold your toes and gently pull them back toward the top of your foot. You should feel a stretch on the base (bottom) of your toes, or the bottom of your foot (plantar fascia), or both. Hold this stretch for__________ seconds. Slowly release your toes and return to the starting position. Repeat __________ times. Complete this exercise __________ times a day. Gastrocnemius stretch, standing This exercise is also called a calf (gastroc) stretch. It stretches the muscles in the back of the upper calf. Stand with your hands against a wall. Extend your left / right leg behind you, and bend your front knee slightly. Keeping your heels on the floor, your toes facing forward, and your back knee straight, shift your weight toward the wall. Do not arch your back. You should feel a gentle stretch in your upper calf. Hold this position for __________ seconds. Repeat __________ times. Complete this exercise __________ times a day. Soleus stretch, standing This exercise is also called a calf (soleus) stretch. It stretches the muscles in the back of the lower calf. Stand with your hands against a wall. Extend your left / right leg behind you, and bend your  front knee slightly. Keeping your heels on the floor and your toes facing forward, bend your back knee and shift your weight slightly over your back leg. You should feel a gentle stretch deep in your lower calf. Hold this position for __________ seconds. Repeat __________ times. Complete this exercise __________ times a day. Gastroc and soleus stretch, standing step This exercise stretches the muscles in the back of the lower leg. These muscles are in the upper calf (gastrocnemius) and the lower calf (soleus). Stand with the ball of your left / right foot on the front of a step. The ball of your foot is on the walking surface, right under your toes. Keep your other foot firmly on the same step. Hold on to the wall or a railing for balance. Slowly lift your other foot, allowing your body weight to press your heel down over the edge of the front of the step. Keep knee straight and unbent. You should feel a stretch in your calf. Hold this position for __________ seconds. Return both feet to the step. Repeat this exercise with a slight bend in your left / right knee. Repeat __________ times with your left / right knee straight and __________ times with your left / right knee bent. Complete this exercise __________ times a day. Balance exercise This exercise builds your balance and strength control of your arch to help take pressure off your plantar fascia. Single leg stand If this exercise is too easy, you can try it with your eyes closed or while standing on a pillow. Without shoes, stand near a  railing or in a doorway. You may hold on to the railing or door frame as needed. Stand on your left / right foot. Keep your big toe down on the floor and lift the arch of your foot. You should feel a stretch across the bottom of your foot and your arch. Do not let your foot roll inward. Hold this position for __________ seconds. Repeat __________ times. Complete this exercise __________ times a day. This  information is not intended to replace advice given to you by your health care provider. Make sure you discuss any questions you have with your health care provider. Document Revised: 08/12/2020 Document Reviewed: 08/12/2020 Elsevier Patient Education  [place].

## 2021-12-07 LAB — CBC WITH DIFFERENTIAL/PLATELET
Absolute Monocytes: 618 cells/uL (ref 200–950)
Basophils Absolute: 57 cells/uL (ref 0–200)
Basophils Relative: 0.6 %
Eosinophils Absolute: 38 cells/uL (ref 15–500)
Eosinophils Relative: 0.4 %
HCT: 44.7 % (ref 38.5–50.0)
Hemoglobin: 15.4 g/dL (ref 13.2–17.1)
Lymphs Abs: 1672 cells/uL (ref 850–3900)
MCH: 30.3 pg (ref 27.0–33.0)
MCHC: 34.5 g/dL (ref 32.0–36.0)
MCV: 88 fL (ref 80.0–100.0)
MPV: 9.8 fL (ref 7.5–12.5)
Monocytes Relative: 6.5 %
Neutro Abs: 7116 cells/uL (ref 1500–7800)
Neutrophils Relative %: 74.9 %
Platelets: 277 10*3/uL (ref 140–400)
RBC: 5.08 10*6/uL (ref 4.20–5.80)
RDW: 12.2 % (ref 11.0–15.0)
Total Lymphocyte: 17.6 %
WBC: 9.5 10*3/uL (ref 3.8–10.8)

## 2021-12-07 LAB — COMPLETE METABOLIC PANEL WITH GFR
AG Ratio: 2 (calc) (ref 1.0–2.5)
ALT: 13 U/L (ref 9–46)
AST: 15 U/L (ref 10–40)
Albumin: 4.8 g/dL (ref 3.6–5.1)
Alkaline phosphatase (APISO): 48 U/L (ref 36–130)
BUN: 16 mg/dL (ref 7–25)
CO2: 30 mmol/L (ref 20–32)
Calcium: 9.6 mg/dL (ref 8.6–10.3)
Chloride: 103 mmol/L (ref 98–110)
Creat: 1.17 mg/dL (ref 0.60–1.29)
Globulin: 2.4 g/dL (calc) (ref 1.9–3.7)
Glucose, Bld: 79 mg/dL (ref 65–99)
Potassium: 4.4 mmol/L (ref 3.5–5.3)
Sodium: 141 mmol/L (ref 135–146)
Total Bilirubin: 1.1 mg/dL (ref 0.2–1.2)
Total Protein: 7.2 g/dL (ref 6.1–8.1)
eGFR: 77 mL/min/{1.73_m2} (ref >=60)

## 2021-12-07 LAB — HEMOGLOBIN A1C
Hgb A1c MFr Bld: 4.9 % of total Hgb (ref <5.7)
Mean Plasma Glucose: 94 mg/dL
eAG (mmol/L): 5.2 mmol/L

## 2021-12-07 LAB — URINALYSIS, ROUTINE W REFLEX MICROSCOPIC
Bilirubin Urine: NEGATIVE
Glucose, UA: NEGATIVE
Hgb urine dipstick: NEGATIVE
Ketones, ur: NEGATIVE
Leukocytes,Ua: NEGATIVE
Nitrite: NEGATIVE
Protein, ur: NEGATIVE
Specific Gravity, Urine: 1.025 (ref 1.001–1.035)
pH: 6.5 (ref 5.0–8.0)

## 2021-12-07 LAB — LIPID PANEL
Cholesterol: 212 mg/dL — ABNORMAL HIGH (ref <200)
HDL: 47 mg/dL (ref >=40)
LDL Cholesterol (Calc): 148 mg/dL (calc) — ABNORMAL HIGH
Non-HDL Cholesterol (Calc): 165 mg/dL (calc) — ABNORMAL HIGH (ref <130)
Total CHOL/HDL Ratio: 4.5 (calc) (ref <5.0)
Triglycerides: 71 mg/dL (ref <150)

## 2021-12-07 LAB — MAGNESIUM: Magnesium: 2 mg/dL (ref 1.5–2.5)

## 2021-12-07 LAB — VITAMIN D 25 HYDROXY (VIT D DEFICIENCY, FRACTURES): Vit D, 25-Hydroxy: 37 ng/mL (ref 30–100)

## 2021-12-07 LAB — TSH: TSH: 1.14 mIU/L (ref 0.40–4.50)

## 2021-12-07 LAB — MICROALBUMIN / CREATININE URINE RATIO
Creatinine, Urine: 242 mg/dL (ref 20–320)
Microalb Creat Ratio: 4 mcg/mg creat (ref <30)
Microalb, Ur: 0.9 mg/dL

## 2021-12-07 LAB — PSA: PSA: 0.52 ng/mL (ref <=4.00)

## 2022-03-08 DIAGNOSIS — L570 Actinic keratosis: Secondary | ICD-10-CM | POA: Diagnosis not present

## 2022-03-08 DIAGNOSIS — C44329 Squamous cell carcinoma of skin of other parts of face: Secondary | ICD-10-CM | POA: Diagnosis not present

## 2022-03-08 DIAGNOSIS — D485 Neoplasm of uncertain behavior of skin: Secondary | ICD-10-CM | POA: Diagnosis not present

## 2022-04-19 DIAGNOSIS — D0439 Carcinoma in situ of skin of other parts of face: Secondary | ICD-10-CM | POA: Diagnosis not present

## 2022-12-06 ENCOUNTER — Encounter: Payer: BC Managed Care – PPO | Admitting: Nurse Practitioner

## 2023-07-27 ENCOUNTER — Encounter (HOSPITAL_BASED_OUTPATIENT_CLINIC_OR_DEPARTMENT_OTHER): Payer: Self-pay | Admitting: Emergency Medicine

## 2023-07-27 ENCOUNTER — Emergency Department (HOSPITAL_BASED_OUTPATIENT_CLINIC_OR_DEPARTMENT_OTHER): Payer: 59

## 2023-07-27 ENCOUNTER — Emergency Department (HOSPITAL_BASED_OUTPATIENT_CLINIC_OR_DEPARTMENT_OTHER)
Admission: EM | Admit: 2023-07-27 | Discharge: 2023-07-28 | Disposition: A | Discharge status: Home / Self Care | Payer: 59 | Attending: Emergency Medicine | Admitting: Emergency Medicine

## 2023-07-27 ENCOUNTER — Other Ambulatory Visit: Payer: Self-pay

## 2023-07-27 DIAGNOSIS — S4991XA Unspecified injury of right shoulder and upper arm, initial encounter: Secondary | ICD-10-CM | POA: Diagnosis present

## 2023-07-27 DIAGNOSIS — S43101A Unspecified dislocation of right acromioclavicular joint, initial encounter: Secondary | ICD-10-CM | POA: Insufficient documentation

## 2023-07-27 DIAGNOSIS — Z85828 Personal history of other malignant neoplasm of skin: Secondary | ICD-10-CM | POA: Diagnosis not present

## 2023-07-27 DIAGNOSIS — Y9355 Activity, bike riding: Secondary | ICD-10-CM | POA: Diagnosis not present

## 2023-07-27 DIAGNOSIS — M25511 Pain in right shoulder: Secondary | ICD-10-CM | POA: Diagnosis not present

## 2023-07-27 DIAGNOSIS — J45909 Unspecified asthma, uncomplicated: Secondary | ICD-10-CM | POA: Insufficient documentation

## 2023-07-27 MED ORDER — KETOROLAC TROMETHAMINE 30 MG/ML IJ SOLN
30.0000 mg | Freq: Once | INTRAMUSCULAR | Status: AC
  Administered 2023-07-28: 30 mg via INTRAMUSCULAR
  Filled 2023-07-27: qty 1
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
## 2023-07-27 NOTE — Discharge Instructions (Signed)
Please take Tylenol and/or Motrin as needed for pain. Please call the orthopedic doctor list, or one of your choice, for follow up in the coming week.
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
## 2023-07-28 DIAGNOSIS — S43121A Dislocation of right acromioclavicular joint, 100%-200% displacement, initial encounter: Secondary | ICD-10-CM | POA: Diagnosis not present

## 2023-08-13 DIAGNOSIS — M24811 Other specific joint derangements of right shoulder, not elsewhere classified: Secondary | ICD-10-CM | POA: Diagnosis not present

## 2023-09-04 DIAGNOSIS — M24111 Other articular cartilage disorders, right shoulder: Secondary | ICD-10-CM | POA: Diagnosis not present

## 2023-09-04 DIAGNOSIS — X58XXXA Exposure to other specified factors, initial encounter: Secondary | ICD-10-CM | POA: Diagnosis not present

## 2023-09-04 DIAGNOSIS — Y999 Unspecified external cause status: Secondary | ICD-10-CM | POA: Diagnosis not present

## 2023-09-04 DIAGNOSIS — S43121A Dislocation of right acromioclavicular joint, 100%-200% displacement, initial encounter: Secondary | ICD-10-CM | POA: Diagnosis not present

## 2023-09-04 DIAGNOSIS — G8918 Other acute postprocedural pain: Secondary | ICD-10-CM | POA: Diagnosis not present

## 2023-09-14 DIAGNOSIS — S43121A Dislocation of right acromioclavicular joint, 100%-200% displacement, initial encounter: Secondary | ICD-10-CM | POA: Diagnosis not present

## 2023-11-19 DIAGNOSIS — M25511 Pain in right shoulder: Secondary | ICD-10-CM | POA: Diagnosis not present

## 2023-11-26 DIAGNOSIS — L821 Other seborrheic keratosis: Secondary | ICD-10-CM | POA: Diagnosis not present

## 2023-11-26 DIAGNOSIS — L570 Actinic keratosis: Secondary | ICD-10-CM | POA: Diagnosis not present

## 2023-11-26 DIAGNOSIS — C44629 Squamous cell carcinoma of skin of left upper limb, including shoulder: Secondary | ICD-10-CM | POA: Diagnosis not present

## 2023-11-26 DIAGNOSIS — L814 Other melanin hyperpigmentation: Secondary | ICD-10-CM | POA: Diagnosis not present

## 2023-11-26 DIAGNOSIS — D485 Neoplasm of uncertain behavior of skin: Secondary | ICD-10-CM | POA: Diagnosis not present

## 2023-12-27 DIAGNOSIS — L905 Scar conditions and fibrosis of skin: Secondary | ICD-10-CM | POA: Diagnosis not present

## 2023-12-27 DIAGNOSIS — C44629 Squamous cell carcinoma of skin of left upper limb, including shoulder: Secondary | ICD-10-CM | POA: Diagnosis not present

## 2024-01-09 DIAGNOSIS — M24811 Other specific joint derangements of right shoulder, not elsewhere classified: Secondary | ICD-10-CM | POA: Diagnosis not present

## 2024-03-17 DIAGNOSIS — M24811 Other specific joint derangements of right shoulder, not elsewhere classified: Secondary | ICD-10-CM | POA: Diagnosis not present

## 2024-07-07 DIAGNOSIS — L821 Other seborrheic keratosis: Secondary | ICD-10-CM | POA: Diagnosis not present

## 2024-07-07 DIAGNOSIS — C44612 Basal cell carcinoma of skin of right upper limb, including shoulder: Secondary | ICD-10-CM | POA: Diagnosis not present

## 2024-07-07 DIAGNOSIS — L570 Actinic keratosis: Secondary | ICD-10-CM | POA: Diagnosis not present

## 2024-07-07 DIAGNOSIS — L814 Other melanin hyperpigmentation: Secondary | ICD-10-CM | POA: Diagnosis not present

## 2024-07-08 DIAGNOSIS — G8918 Other acute postprocedural pain: Secondary | ICD-10-CM | POA: Diagnosis not present

## 2024-07-08 DIAGNOSIS — M25311 Other instability, right shoulder: Secondary | ICD-10-CM | POA: Diagnosis not present

## 2024-07-10 DIAGNOSIS — L570 Actinic keratosis: Secondary | ICD-10-CM | POA: Diagnosis not present

## 2024-07-17 DIAGNOSIS — M25311 Other instability, right shoulder: Secondary | ICD-10-CM | POA: Diagnosis not present

## 2024-07-31 DIAGNOSIS — L570 Actinic keratosis: Secondary | ICD-10-CM | POA: Diagnosis not present
